# Patient Record
Sex: Female | Born: 1980 | Race: Black or African American | Hispanic: No | Marital: Married | Smoking: Never smoker
Health system: Southern US, Community
[De-identification: ages and names within clinical notes are randomized; demographics above are authoritative.]

## PROBLEM LIST (undated history)

## (undated) ENCOUNTER — Inpatient Hospital Stay (HOSPITAL_COMMUNITY): Payer: Self-pay

## (undated) DIAGNOSIS — N261 Atrophy of kidney (terminal): Secondary | ICD-10-CM

## (undated) HISTORY — DX: Atrophy of kidney (terminal): N26.1

---

## 2017-12-23 ENCOUNTER — Encounter (HOSPITAL_COMMUNITY): Payer: Self-pay | Admitting: *Deleted

## 2017-12-23 ENCOUNTER — Other Ambulatory Visit: Payer: Self-pay

## 2017-12-23 ENCOUNTER — Inpatient Hospital Stay (HOSPITAL_COMMUNITY): Payer: Self-pay

## 2017-12-23 ENCOUNTER — Inpatient Hospital Stay (HOSPITAL_COMMUNITY)
Admission: AD | Admit: 2017-12-23 | Discharge: 2017-12-23 | Disposition: A | Discharge status: Home / Self Care | Payer: PRIVATE HEALTH INSURANCE | Source: Ambulatory Visit | Attending: Family Medicine | Admitting: Family Medicine

## 2017-12-23 DIAGNOSIS — O208 Other hemorrhage in early pregnancy: Secondary | ICD-10-CM | POA: Insufficient documentation

## 2017-12-23 DIAGNOSIS — O418X10 Other specified disorders of amniotic fluid and membranes, first trimester, not applicable or unspecified: Secondary | ICD-10-CM | POA: Diagnosis not present

## 2017-12-23 DIAGNOSIS — R102 Pelvic and perineal pain: Secondary | ICD-10-CM | POA: Insufficient documentation

## 2017-12-23 DIAGNOSIS — O468X1 Other antepartum hemorrhage, first trimester: Secondary | ICD-10-CM | POA: Diagnosis not present

## 2017-12-23 DIAGNOSIS — O26891 Other specified pregnancy related conditions, first trimester: Secondary | ICD-10-CM | POA: Insufficient documentation

## 2017-12-23 DIAGNOSIS — Z3A10 10 weeks gestation of pregnancy: Secondary | ICD-10-CM | POA: Insufficient documentation

## 2017-12-23 DIAGNOSIS — O26899 Other specified pregnancy related conditions, unspecified trimester: Secondary | ICD-10-CM

## 2017-12-23 LAB — URINALYSIS, ROUTINE W REFLEX MICROSCOPIC
Bilirubin Urine: NEGATIVE
Glucose, UA: NEGATIVE mg/dL
Ketones, ur: NEGATIVE mg/dL
Nitrite: NEGATIVE
Protein, ur: NEGATIVE mg/dL
RBC / HPF: >50 RBC/hpf — ABNORMAL HIGH (ref 0–5)
Specific Gravity, Urine: 1.02 (ref 1.005–1.030)
WBC, UA: >50 WBC/hpf — ABNORMAL HIGH (ref 0–5)
pH: 6 (ref 5.0–8.0)

## 2017-12-23 LAB — HCG, QUANTITATIVE, PREGNANCY: hCG, Beta Chain, Quant, S: 7729 m[IU]/mL — ABNORMAL HIGH (ref <5)

## 2017-12-23 LAB — POCT PREGNANCY, URINE: Preg Test, Ur: POSITIVE — AB

## 2017-12-23 MED ORDER — PROMETHAZINE HCL 25 MG PO TABS: 25.0000 mg | ORAL_TABLET | Freq: Four times a day (QID) | ORAL | 2 refills | Status: DC | PRN

## 2017-12-23 NOTE — MAU Provider Note (Addendum)
History     CSN: [id number]  Arrival date and time: 12/23/17 1111   First Provider Initiated Contact with Patient 12/23/17 1203      Chief Complaint  Patient presents with  . Vaginal Bleeding  . Possible Pregnancy   HPI 37 yo G4P4004 at [redacted]w[redacted]d by LMP, who presents with vaginal bleeding that started Sunday. Had a small amount of blood in toilet when she got up. No bleeding currently. Cramping, nausea, diarrhea since Sunday, which are still continuing.   LMP 8/6, 10 weeks today.  OB History    Gravida  4   Para  4   Term  4   Preterm      AB      Living        SAB      TAB      Ectopic      Multiple      Live Births              History reviewed. No pertinent past medical history.  History reviewed. No pertinent surgical history.  History reviewed. No pertinent family history.  Social History   Tobacco Use  . Smoking status: Never Smoker  . Smokeless tobacco: Never Used  Substance Use Topics  . Alcohol use: Never    Frequency: Never  . Drug use: Never    Allergies: Not on File  Medications Prior to Admission  Medication Sig Dispense Refill Last Dose  . ibuprofen (ADVIL,MOTRIN) 200 MG tablet Take 200 mg by mouth every 6 (six) hours as needed.   12/22/2017 at Unknown time    Review of Systems  All other systems reviewed and are negative.  Physical Exam   Blood pressure 132/67, pulse 73, temperature 98.9 F (37.2 C), temperature source Oral, resp. rate 17, height 5\' 6"  (1.676 m), weight 81.5 kg, last menstrual period 10/14/2017, SpO2 100 %.  Physical Exam  Constitutional: She appears well-developed and well-nourished.  HENT:  Head: Normocephalic and atraumatic.  Right Ear: External ear normal.  Left Ear: External ear normal.  Eyes: Pupils are equal, round, and reactive to light. Conjunctivae are normal.  Neck: Normal range of motion. Neck supple.  Cardiovascular: Normal rate, regular rhythm and normal heart sounds.  Respiratory:  Effort normal and breath sounds normal.  GI: Soft. Bowel sounds are normal. She exhibits no distension and no mass. There is no tenderness. There is no rebound and no guarding.  Skin: Skin is warm and dry. No rash noted. No erythema. No pallor.  Psychiatric: She has a normal mood and affect. Her behavior is normal. Judgment and thought content normal.   Results for orders placed or performed during the hospital encounter of 12/23/17 (from the past 24 hour(s))  hCG, quantitative, pregnancy     Status: Abnormal   Collection Time: 12/23/17 12:18 PM  Result Value Ref Range   hCG, Beta Chain, Quant, S 7,729 (H) <5 mIU/mL  Urinalysis, Routine w reflex microscopic     Status: Abnormal   Collection Time: 12/23/17 12:28 PM  Result Value Ref Range   Color, Urine YELLOW YELLOW   APPearance HAZY (A) CLEAR   Specific Gravity, Urine 1.020 1.005 - 1.030   pH 6.0 5.0 - 8.0   Glucose, UA NEGATIVE NEGATIVE mg/dL   Hgb urine dipstick LARGE (A) NEGATIVE   Bilirubin Urine NEGATIVE NEGATIVE   Ketones, ur NEGATIVE NEGATIVE mg/dL   Protein, ur NEGATIVE NEGATIVE mg/dL   Nitrite NEGATIVE NEGATIVE   Leukocytes, UA LARGE (A)  NEGATIVE   RBC / HPF >50 (H) 0 - 5 RBC/hpf   WBC, UA >50 (H) 0 - 5 WBC/hpf   Bacteria, UA RARE (A) NONE SEEN   Squamous Epithelial / LPF 0-5 0 - 5   Mucus PRESENT   Pregnancy, urine POC     Status: Abnormal   Collection Time: 12/23/17 12:34 PM  Result Value Ref Range   Preg Test, Ur POSITIVE (A) NEGATIVE   US Ob Less Than 14 Weeks With Ob Transvaginal  Result Date: 12/23/2017 CLINICAL DATA:  Pelvic pain and bleeding EXAM: OBSTETRIC <14 WK [place] AND TRANSVAGINAL OB US TECHNIQUE: Both transabdominal and transvaginal ultrasound examinations were performed for complete evaluation of the gestation as well as the maternal uterus, adnexal regions, and pelvic cul-de-sac. Transvaginal technique was performed to assess early pregnancy. COMPARISON:  None FINDINGS: Intrauterine gestational sac:  Present, single Yolk sac:  Present Embryo:  Not definitely visualized Cardiac Activity: N/A Heart Rate: N/A  bpm MSD: 23.2 mm   7 w   0 d CRL:    mm    w    d                  [place] EDC: Subchorionic hemorrhage:  Moderate subchronic hemorrhage. Maternal uterus/adnexae: RIGHT ovary normal size and morphology 3.6 x 1.3 x 2.6 cm. LEFT ovary measures 3.6 x 2.3 x 4.0 cm and contains a dominant follicle 2.3 cm diameter as well as a small complex corpus luteum. Trace free pelvic fluid. No additional adnexal masses. IMPRESSION: Gestational sac within the uterus containing a yolk sac but no fetal pole. Moderate subchronic hemorrhage. Findings are suspicious but not yet definitive for failed pregnancy. Recommend follow-up US in 10-14 days for definitive diagnosis. This recommendation follows SRU consensus guidelines: Diagnostic Criteria for Nonviable Pregnancy Early in the First Trimester. Malva Limes Med 2013; 161:0960-45. Electronically Signed   By: Ulyses Southward M.D.   On: 12/23/2017 14:19     MAU Course  Procedures  MDM  Assessment and Plan  Subchorionic hematoma in first trimester, single or unspecified fetus  Pelvic pain affecting pregnancy - Plan: US OB LESS THAN 14 WEEKS WITH OB TRANSVAGINAL, US OB LESS THAN 14 WEEKS WITH OB TRANSVAGINAL  GS Measuring 7wks. Yolk sac present. Establish care.  Phenergan for n/v Will need rpt [place] in 10 days.  Levie Heritage 12/23/2017, 12:03 PM

## 2017-12-23 NOTE — MAU Note (Signed)
Is 2 months preg.  Noted bleeding yesterday.  Just a little bit. No pain.

## 2017-12-23 NOTE — Discharge Instructions (Signed)
Subchorionic Hematoma °A subchorionic hematoma is a gathering of blood between the outer wall of the placenta and the inner wall of the womb (uterus). The placenta is the organ that connects the fetus to the wall of the uterus. The placenta performs the feeding, breathing (oxygen to the fetus), and waste removal (excretory work) of the fetus. °Subchorionic hematoma is the most common abnormality found on a result from ultrasonography done during the first trimester or early second trimester of pregnancy. If there has been little or no vaginal bleeding, early small hematomas usually shrink on their own and do not affect your baby or pregnancy. The blood is gradually absorbed over 1-2 weeks. When bleeding starts later in pregnancy or the hematoma is larger or occurs in an older pregnant woman, the outcome may not be as good. Larger hematomas may get bigger, which increases the chances for miscarriage. Subchorionic hematoma also increases the risk of premature detachment of the placenta from the uterus, preterm (premature) labor, and stillbirth. °Follow these instructions at home: °· Stay on bed rest if your health care provider recommends this. Although bed rest will not prevent more bleeding or prevent a miscarriage, your health care provider may recommend bed rest until you are advised otherwise. °· Avoid heavy lifting (more than 10 lb [4.5 kg]), exercise, sexual intercourse, or douching as directed by your health care provider. °· Keep track of the number of pads you use each day and how soaked (saturated) they are. Write down this information. °· Do not use tampons. °· Keep all follow-up appointments as directed by your health care provider. Your health care provider may ask you to have follow-up blood tests or ultrasound tests or both. °Get help right away if: °· You have severe cramps in your stomach, back, abdomen, or pelvis. °· You have a fever. °· You pass large clots or tissue. Save any tissue for your  health care provider to look at. °· Your bleeding increases or you become lightheaded, feel weak, or have fainting episodes. °This information is not intended to replace advice given to you by your health care provider. Make sure you discuss any questions you have with your health care provider. °Document Released: 06/12/2006 Document Revised: 08/03/2015 Document Reviewed: 09/24/2012 °Elsevier Interactive Patient Education © 2017 [employer] Inc. ° °

## 2017-12-23 NOTE — MAU Note (Signed)
Pt reports bleeding that started Sunday morning.

## 2018-01-21 DIAGNOSIS — O9A212 Injury, poisoning and certain other consequences of external causes complicating pregnancy, second trimester: Secondary | ICD-10-CM | POA: Diagnosis not present

## 2018-01-21 DIAGNOSIS — O9989 Other specified diseases and conditions complicating pregnancy, childbirth and the puerperium: Secondary | ICD-10-CM | POA: Diagnosis not present

## 2018-01-21 DIAGNOSIS — M545 Low back pain: Secondary | ICD-10-CM | POA: Diagnosis not present

## 2018-01-21 DIAGNOSIS — R935 Abnormal findings on diagnostic imaging of other abdominal regions, including retroperitoneum: Secondary | ICD-10-CM | POA: Diagnosis not present

## 2018-01-21 DIAGNOSIS — Z3A14 14 weeks gestation of pregnancy: Secondary | ICD-10-CM | POA: Diagnosis not present

## 2018-01-21 DIAGNOSIS — O3680X0 Pregnancy with inconclusive fetal viability, not applicable or unspecified: Secondary | ICD-10-CM | POA: Diagnosis not present

## 2018-01-21 DIAGNOSIS — S39012A Strain of muscle, fascia and tendon of lower back, initial encounter: Secondary | ICD-10-CM | POA: Diagnosis not present

## 2018-01-27 ENCOUNTER — Encounter: Payer: Self-pay | Admitting: General Practice

## 2018-01-27 DIAGNOSIS — O200 Threatened abortion: Secondary | ICD-10-CM

## 2018-01-27 NOTE — Progress Notes (Signed)
Patient & her husband came by the office today trying to schedule initial OB visit. Upon chart review, patient had ultrasound 10/15 that was to be repeated in 10-14 days. Ultrasound ordered & scheduled 11/25 @ 1pm. Patient's husband informed. Advised him to bring patient to MAU for any severe pain/bleeding. He verbalized understanding.

## 2018-02-02 ENCOUNTER — Encounter: Payer: Self-pay | Admitting: Family Medicine

## 2018-02-02 ENCOUNTER — Other Ambulatory Visit: Payer: Self-pay | Admitting: Family Medicine

## 2018-02-02 ENCOUNTER — Ambulatory Visit (HOSPITAL_COMMUNITY)
Admission: RE | Admit: 2018-02-02 | Discharge: 2018-02-02 | Disposition: A | Discharge status: Home / Self Care | Payer: Medicaid Other | Source: Ambulatory Visit | Attending: Family Medicine | Admitting: Family Medicine

## 2018-02-02 ENCOUNTER — Encounter: Payer: Self-pay | Admitting: *Deleted

## 2018-02-02 ENCOUNTER — Ambulatory Visit: Payer: Medicaid Other | Admitting: *Deleted

## 2018-02-02 VITALS — BP 116/53 | HR 70 | Wt 175.0 lb

## 2018-02-02 DIAGNOSIS — N83291 Other ovarian cyst, right side: Secondary | ICD-10-CM | POA: Diagnosis not present

## 2018-02-02 DIAGNOSIS — O200 Threatened abortion: Secondary | ICD-10-CM | POA: Insufficient documentation

## 2018-02-02 DIAGNOSIS — O3481 Maternal care for other abnormalities of pelvic organs, first trimester: Secondary | ICD-10-CM | POA: Diagnosis not present

## 2018-02-02 DIAGNOSIS — Z3A00 Weeks of gestation of pregnancy not specified: Secondary | ICD-10-CM | POA: Insufficient documentation

## 2018-02-02 NOTE — Progress Notes (Signed)
Chart reviewed - agree with RN documentation.   
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
## 2018-02-03 ENCOUNTER — Encounter: Payer: Self-pay | Admitting: Obstetrics and Gynecology

## 2018-02-03 ENCOUNTER — Ambulatory Visit (INDEPENDENT_AMBULATORY_CARE_PROVIDER_SITE_OTHER): Payer: Medicaid Other | Admitting: Obstetrics and Gynecology

## 2018-02-03 VITALS — BP 143/79 | HR 96 | Wt 174.9 lb

## 2018-02-03 DIAGNOSIS — O021 Missed abortion: Secondary | ICD-10-CM

## 2018-02-03 LAB — BETA HCG QUANT (REF LAB): hCG Quant: 183 m[IU]/mL

## 2018-02-03 NOTE — Progress Notes (Signed)
Video interperter # [id number]

## 2018-02-03 NOTE — Progress Notes (Signed)
37 yo G5P4 presenting today to discuss results of ultrasound and pregnancy hormone collected yesterday. Patient reports some lower abdominal cramping pain. She denies any vaginal bleeding since October.   No past medical history on file. No past surgical history on file. No family history on file. Social History   Tobacco Use  . Smoking status: Never Smoker  . Smokeless tobacco: Never Used  Substance Use Topics  . Alcohol use: Never    Frequency: Never  . Drug use: Never   ROS See pertinent in HPI  Blood pressure (!) 143/79, pulse 96, weight 174 lb 14.4 oz (79.3 kg), last menstrual period 10/14/2017. GENERAL: Well-developed, well-nourished female in no acute distress.  LUNGS: Clear to auscultation bilaterally.  HEART: Regular rate and rhythm. ABDOMEN: Soft, nontender, nondistended. No organomegaly. PELVIC: Normal external female genitalia. Vagina is pink and rugated.  Normal discharge. Normal appearing cervix. Uterus is normal in size. No adnexal mass or tenderness. EXTREMITIES: No cyanosis, clubbing, or edema, 2+ distal pulses.  [place] Ob Less Than 14 Weeks With Ob Transvaginal  Result Date: 02/02/2018 CLINICAL DATA:  Threatened abortion, confirm viability; no quantitative beta HCG for correlation EXAM: OBSTETRIC <14 WK US AND TRANSVAGINAL OB US TECHNIQUE: Both transabdominal and transvaginal ultrasound examinations were performed for complete evaluation of the gestation as well as the maternal uterus, adnexal regions, and pelvic cul-de-sac. Transvaginal technique was performed to assess early pregnancy. COMPARISON:  12/23/2017 FINDINGS: Intrauterine gestational sac: Irregular sac seen within central uterus with surrounding abnormal complex tissue/fluid Yolk sac:  Not identified Embryo:  Not identified Cardiac Activity: N/A Heart Rate: N/A  bpm MSD:   mm    w     d CRL:    mm    w    d                  US EDC: Subchorionic hemorrhage:  N/A Maternal uterus/adnexae: Centrally within the  uterus, a dominant irregular fluid collection is seen with surrounding abnormal complex soft tissue/fluid, collection overall measuring 3.5 x 2.6 x 3.8 cm. This could represent an intrauterine gestational sac with extensive subchronic hemorrhage or gestational trophoblastic disease. No free pelvic fluid. LEFT ovary normal size and morphology, 3.8 x 2.0 x 3.5 cm. RIGHT ovary 4.9 x 5.1 x 3.8 cm and contains a large simple cyst 4.3 x 3.2 x 4.2 cm. Trace free pelvic fluid. No other adnexal masses. IMPRESSION: Large simple RIGHT ovarian cyst 4.3 cm diameter. Irregular dominant fluid collection centrally within uterus with extensive surrounding abnormal complex soft tissue and fluid collections, overall measuring 3.5 x 2.6 x 3.8 cm in size. This could either represent an intrauterine gestational sac with surrounding extensive subchronic hemorrhage or gestational trophoblastic disease. Correlation with current quantitative beta HCG recommended, as well as follow-up serial quantitative beta HCG and/or sonography. Electronically Signed   By: Ulyses SouthwardMark  Boles M.D.   On: 02/02/2018 14:08   Quant HCG 7729 (12/23/2017)--> 183 (02/02/2018)  A/P 37 yo with missed abortion - Ultrasound and quant HCG results reviewed with the patient - Emotional support provided - Discussed options of expectant management, medical management with cytotec or surgical evacuation with D&E. Patient opted for D&E to accommodate her work schedule. Patient will be scheduled

## 2018-02-09 ENCOUNTER — Encounter (HOSPITAL_COMMUNITY): Payer: Self-pay

## 2018-02-13 ENCOUNTER — Encounter (HOSPITAL_COMMUNITY)
Admission: RE | Disposition: A | Discharge status: Home / Self Care | Payer: Self-pay | Source: Ambulatory Visit | Attending: Obstetrics and Gynecology

## 2018-02-13 ENCOUNTER — Other Ambulatory Visit: Payer: Self-pay

## 2018-02-13 ENCOUNTER — Ambulatory Visit (HOSPITAL_COMMUNITY): Payer: Medicaid Other | Admitting: Anesthesiology

## 2018-02-13 ENCOUNTER — Encounter (HOSPITAL_COMMUNITY): Payer: Self-pay | Admitting: Emergency Medicine

## 2018-02-13 ENCOUNTER — Other Ambulatory Visit (HOSPITAL_COMMUNITY): Payer: Medicaid Other

## 2018-02-13 ENCOUNTER — Ambulatory Visit (HOSPITAL_COMMUNITY): Payer: Medicaid Other

## 2018-02-13 ENCOUNTER — Ambulatory Visit (HOSPITAL_COMMUNITY)
Admission: RE | Admit: 2018-02-13 | Discharge: 2018-02-13 | Disposition: A | Discharge status: Home / Self Care | Payer: Medicaid Other | Source: Ambulatory Visit | Attending: Obstetrics and Gynecology | Admitting: Obstetrics and Gynecology

## 2018-02-13 DIAGNOSIS — O039 Complete or unspecified spontaneous abortion without complication: Secondary | ICD-10-CM

## 2018-02-13 DIAGNOSIS — Z79899 Other long term (current) drug therapy: Secondary | ICD-10-CM | POA: Diagnosis not present

## 2018-02-13 DIAGNOSIS — Z3A14 14 weeks gestation of pregnancy: Secondary | ICD-10-CM | POA: Insufficient documentation

## 2018-02-13 DIAGNOSIS — O021 Missed abortion: Secondary | ICD-10-CM | POA: Diagnosis not present

## 2018-02-13 DIAGNOSIS — N83291 Other ovarian cyst, right side: Secondary | ICD-10-CM | POA: Insufficient documentation

## 2018-02-13 HISTORY — PX: DILATION AND EVACUATION: SHX1459

## 2018-02-13 LAB — TYPE AND SCREEN
ABO/RH(D): O POS
Antibody Screen: NEGATIVE

## 2018-02-13 LAB — CBC
HCT: 36.9 % (ref 36.0–46.0)
Hemoglobin: 12 g/dL (ref 12.0–15.0)
MCH: 27.5 pg (ref 26.0–34.0)
MCHC: 32.5 g/dL (ref 30.0–36.0)
MCV: 84.4 fL (ref 80.0–100.0)
Platelets: 199 10*3/uL (ref 150–400)
RBC: 4.37 MIL/uL (ref 3.87–5.11)
RDW: 13.7 % (ref 11.5–15.5)
WBC: 4.6 10*3/uL (ref 4.0–10.5)
nRBC: 0 % (ref 0.0–0.2)

## 2018-02-13 LAB — BASIC METABOLIC PANEL
Anion gap: 7 (ref 5–15)
BUN: 17 mg/dL (ref 6–20)
CO2: 26 mmol/L (ref 22–32)
Calcium: 9.2 mg/dL (ref 8.9–10.3)
Chloride: 103 mmol/L (ref 98–111)
Creatinine, Ser: 0.76 mg/dL (ref 0.44–1.00)
GFR calc Af Amer: >60 mL/min (ref >60)
GFR calc non Af Amer: >60 mL/min (ref >60)
Glucose, Bld: 106 mg/dL — ABNORMAL HIGH (ref 70–99)
Potassium: 3.9 mmol/L (ref 3.5–5.1)
Sodium: 136 mmol/L (ref 135–145)

## 2018-02-13 LAB — ABO/RH: ABO/RH(D): O POS

## 2018-02-13 SURGERY — DILATION AND EVACUATION, UTERUS
Anesthesia: General

## 2018-02-13 MED ORDER — DOXYCYCLINE HYCLATE 100 MG IV SOLR: 200.0000 mg | INTRAVENOUS | Status: DC

## 2018-02-13 MED ORDER — ONDANSETRON HCL 4 MG/2ML IJ SOLN
INTRAMUSCULAR | Status: AC
  Filled 2018-02-13: qty 2

## 2018-02-13 MED ORDER — METOCLOPRAMIDE HCL 5 MG/ML IJ SOLN: 10.0000 mg | Freq: Once | INTRAMUSCULAR | Status: DC | PRN

## 2018-02-13 MED ORDER — LACTATED RINGERS IV SOLN: INTRAVENOUS | Status: DC

## 2018-02-13 MED ORDER — KETOROLAC TROMETHAMINE 30 MG/ML IJ SOLN
INTRAMUSCULAR | Status: AC
  Filled 2018-02-13: qty 1

## 2018-02-13 MED ORDER — FENTANYL CITRATE (PF) 100 MCG/2ML IJ SOLN
INTRAMUSCULAR | Status: AC
  Filled 2018-02-13: qty 2

## 2018-02-13 MED ORDER — LACTATED RINGERS IV SOLN
INTRAVENOUS | Status: DC
  Administered 2018-02-13: 15:00:00 via INTRAVENOUS

## 2018-02-13 MED ORDER — MIDAZOLAM HCL 2 MG/2ML IJ SOLN
INTRAMUSCULAR | Status: AC
  Filled 2018-02-13: qty 2

## 2018-02-13 MED ORDER — FENTANYL CITRATE (PF) 100 MCG/2ML IJ SOLN
INTRAMUSCULAR | Status: DC | PRN
  Administered 2018-02-13 (×2): 50 ug via INTRAVENOUS

## 2018-02-13 MED ORDER — SCOPOLAMINE 1 MG/3DAYS TD PT72
1.0000 | MEDICATED_PATCH | Freq: Once | TRANSDERMAL | Status: DC
  Administered 2018-02-13: 1.5 mg via TRANSDERMAL

## 2018-02-13 MED ORDER — HYDROCODONE-ACETAMINOPHEN 7.5-325 MG PO TABS
ORAL_TABLET | ORAL | Status: AC
  Filled 2018-02-13: qty 1

## 2018-02-13 MED ORDER — MEPERIDINE HCL 25 MG/ML IJ SOLN: 6.2500 mg | INTRAMUSCULAR | Status: DC | PRN

## 2018-02-13 MED ORDER — LIDOCAINE HCL (CARDIAC) PF 100 MG/5ML IV SOSY
PREFILLED_SYRINGE | INTRAVENOUS | Status: DC | PRN
  Administered 2018-02-13: 60 mg via INTRAVENOUS

## 2018-02-13 MED ORDER — SUCCINYLCHOLINE CHLORIDE 20 MG/ML IJ SOLN
INTRAMUSCULAR | Status: DC | PRN
  Administered 2018-02-13: 120 mg via INTRAVENOUS

## 2018-02-13 MED ORDER — FENTANYL CITRATE (PF) 100 MCG/2ML IJ SOLN: 25.0000 ug | INTRAMUSCULAR | Status: DC | PRN

## 2018-02-13 MED ORDER — DEXAMETHASONE SODIUM PHOSPHATE 4 MG/ML IJ SOLN
INTRAMUSCULAR | Status: AC
  Filled 2018-02-13: qty 1

## 2018-02-13 MED ORDER — SUCCINYLCHOLINE CHLORIDE 200 MG/10ML IV SOSY
PREFILLED_SYRINGE | INTRAVENOUS | Status: AC
  Filled 2018-02-13: qty 10

## 2018-02-13 MED ORDER — MIDAZOLAM HCL 2 MG/2ML IJ SOLN
INTRAMUSCULAR | Status: DC | PRN
  Administered 2018-02-13: 2 mg via INTRAVENOUS

## 2018-02-13 MED ORDER — HYDROCODONE-ACETAMINOPHEN 7.5-325 MG PO TABS
1.0000 | ORAL_TABLET | Freq: Once | ORAL | Status: AC | PRN
  Administered 2018-02-13: 1 via ORAL

## 2018-02-13 MED ORDER — SCOPOLAMINE 1 MG/3DAYS TD PT72
MEDICATED_PATCH | TRANSDERMAL | Status: AC
  Administered 2018-02-13: 1.5 mg via TRANSDERMAL
  Filled 2018-02-13: qty 1

## 2018-02-13 MED ORDER — PROPOFOL 10 MG/ML IV BOLUS
INTRAVENOUS | Status: AC
  Filled 2018-02-13: qty 20

## 2018-02-13 MED ORDER — ONDANSETRON HCL 4 MG/2ML IJ SOLN
INTRAMUSCULAR | Status: DC | PRN
  Administered 2018-02-13: 4 mg via INTRAVENOUS

## 2018-02-13 MED ORDER — OXYCODONE-ACETAMINOPHEN 5-325 MG PO TABS: 1.0000 | ORAL_TABLET | Freq: Four times a day (QID) | ORAL | 0 refills | Status: DC | PRN

## 2018-02-13 MED ORDER — DOXYCYCLINE HYCLATE 100 MG IV SOLR
200.0000 mg | INTRAVENOUS | Status: AC
  Administered 2018-02-13: 200 mg via INTRAVENOUS
  Filled 2018-02-13: qty 200

## 2018-02-13 MED ORDER — LIDOCAINE HCL (CARDIAC) PF 100 MG/5ML IV SOSY
PREFILLED_SYRINGE | INTRAVENOUS | Status: AC
  Filled 2018-02-13: qty 5

## 2018-02-13 MED ORDER — DEXAMETHASONE SODIUM PHOSPHATE 10 MG/ML IJ SOLN
INTRAMUSCULAR | Status: DC | PRN
  Administered 2018-02-13: 8 mg via INTRAVENOUS

## 2018-02-13 MED ORDER — PROPOFOL 10 MG/ML IV BOLUS
INTRAVENOUS | Status: DC | PRN
  Administered 2018-02-13: 200 mg via INTRAVENOUS
  Administered 2018-02-13: 100 mg via INTRAVENOUS

## 2018-02-13 MED ORDER — IBUPROFEN 600 MG PO TABS: 600.0000 mg | ORAL_TABLET | Freq: Four times a day (QID) | ORAL | 1 refills | Status: DC | PRN

## 2018-02-13 SURGICAL SUPPLY — 19 items
CATH ROBINSON RED A/P 16FR (CATHETERS) ×2 IMPLANT
DECANTER SPIKE VIAL GLASS SM (MISCELLANEOUS) ×2 IMPLANT
GLOVE BIOGEL PI IND STRL 6.5 (GLOVE) ×1 IMPLANT
GLOVE BIOGEL PI IND STRL 7.0 (GLOVE) ×1 IMPLANT
GLOVE BIOGEL PI INDICATOR 6.5 (GLOVE) ×1
GLOVE BIOGEL PI INDICATOR 7.0 (GLOVE) ×1
GLOVE ORTHOPEDIC STR SZ6.5 (GLOVE) ×2 IMPLANT
GOWN STRL REUS W/TWL LRG LVL3 (GOWN DISPOSABLE) ×4 IMPLANT
KIT BERKELEY 1ST TRIMESTER 3/8 (MISCELLANEOUS) ×2 IMPLANT
NS IRRIG 1000ML POUR BTL (IV SOLUTION) ×2 IMPLANT
PACK VAGINAL MINOR WOMEN LF (CUSTOM PROCEDURE TRAY) ×2 IMPLANT
PAD OB MATERNITY 4.3X12.25 (PERSONAL CARE ITEMS) ×2 IMPLANT
PAD PREP 24X48 CUFFED NSTRL (MISCELLANEOUS) ×2 IMPLANT
SET BERKELEY SUCTION TUBING (SUCTIONS) ×2 IMPLANT
TOWEL OR 17X24 6PK STRL BLUE (TOWEL DISPOSABLE) ×4 IMPLANT
VACURETTE 10 RIGID CVD (CANNULA) IMPLANT
VACURETTE 7MM CVD STRL WRAP (CANNULA) IMPLANT
VACURETTE 8 RIGID CVD (CANNULA) ×2 IMPLANT
VACURETTE 9 RIGID CVD (CANNULA) IMPLANT

## 2018-02-13 NOTE — Anesthesia Postprocedure Evaluation (Signed)
Anesthesia Post Note  Patient: Corena HerterBerthy Virgin  Procedure(s) Performed: DILATATION AND EVACUATION (N/A )     Patient location during evaluation: PACU Anesthesia Type: General Level of consciousness: awake and alert and oriented Pain management: pain level controlled Vital Signs Assessment: post-procedure vital signs reviewed and stable Respiratory status: spontaneous breathing, nonlabored ventilation and respiratory function stable Cardiovascular status: stable and blood pressure returned to baseline Postop Assessment: no apparent nausea or vomiting Anesthetic complications: no    Last Vitals:  Vitals:   02/13/18 1645 02/13/18 1650  BP: (!) 141/71   Pulse: (!) 54 (!) 58  Resp: 12 17  Temp:    SpO2: 94% 97%    Last Pain:  Vitals:   02/13/18 1645  TempSrc:   PainSc: 2    Pain Goal: Patients Stated Pain Goal: 5 (02/13/18 1645)               Alahna Dunne A.

## 2018-02-13 NOTE — Anesthesia Procedure Notes (Signed)
Procedure Name: Intubation Date/Time: 02/13/2018 3:22 PM Performed by: Hewitt Blade, CRNA Pre-anesthesia Checklist: Patient identified, Emergency Drugs available, Suction available and Patient being monitored Patient Re-evaluated:Patient Re-evaluated prior to induction Oxygen Delivery Method: Circle system utilized Preoxygenation: Pre-oxygenation with 100% oxygen Induction Type: IV induction Ventilation: Mask ventilation without difficulty Laryngoscope Size: Mac and 4 Grade View: Grade II Tube type: Oral Tube size: 7.0 mm Number of attempts: 2 Airway Equipment and Method: Stylet Placement Confirmation: ETT inserted through vocal cords under direct vision,  positive ETCO2 and breath sounds checked- equal and bilateral Secured at: 22 cm Tube secured with: Tape Dental Injury: Teeth and Oropharynx as per pre-operative assessment

## 2018-02-13 NOTE — Transfer of Care (Signed)
Immediate Anesthesia Transfer of Care Note  Patient: Jackie HerterBerthy Yount  Procedure(s) Performed: DILATATION AND EVACUATION (N/A )  Patient Location: PACU  Anesthesia Type:General  Level of Consciousness: awake, alert  and oriented  Airway & Oxygen Therapy: Patient Spontanous Breathing and Patient connected to nasal cannula oxygen  Post-op Assessment: Report given to RN, Post -op Vital signs reviewed and stable and Patient moving all extremities  Post vital signs: Reviewed and stable  Last Vitals:  Vitals Value Taken Time  BP 148/79 02/13/2018  3:57 PM  Temp    Pulse 73 02/13/2018  3:59 PM  Resp 24 02/13/2018  3:59 PM  SpO2 100 % 02/13/2018  3:59 PM  Vitals shown include unvalidated device data.  Last Pain:  Vitals:   02/13/18 1425  TempSrc: Oral  PainSc: 2       Patients Stated Pain Goal: 5 (02/13/18 1425)  Complications: No apparent anesthesia complications

## 2018-02-13 NOTE — Anesthesia Preprocedure Evaluation (Addendum)
Anesthesia Evaluation  Patient identified by MRN, date of birth, ID band Patient awake    Reviewed: Allergy & Precautions, NPO status , Patient's Chart, lab work & pertinent test results  Airway Mallampati: III  TM Distance: >3 FB Neck ROM: Full    Dental no notable dental hx. (+) Teeth Intact   Pulmonary neg pulmonary ROS,    Pulmonary exam normal breath sounds clear to auscultation       Cardiovascular negative cardio ROS Normal cardiovascular exam Rhythm:Regular Rate:Normal     Neuro/Psych negative neurological ROS  negative psych ROS   GI/Hepatic negative GI ROS, Neg liver ROS,   Endo/Other  negative endocrine ROS  Renal/GU negative Renal ROS  negative genitourinary   Musculoskeletal negative musculoskeletal ROS (+)   Abdominal   Peds  Hematology negative hematology ROS (+)   Anesthesia Other Findings   Reproductive/Obstetrics Missed Ab                             Anesthesia Physical Anesthesia Plan  ASA: II  Anesthesia Plan: General   Post-op Pain Management:    Induction:   PONV Risk Score and Plan: 3 and Midazolam, Ondansetron, Dexamethasone, Treatment may vary due to age or medical condition and Propofol infusion  Airway Management Planned: LMA  Additional Equipment:   Intra-op Plan:   Post-operative Plan: Extubation in OR  Informed Consent: I have reviewed the patients History and Physical, chart, labs and discussed the procedure including the risks, benefits and alternatives for the proposed anesthesia with the patient or authorized representative who has indicated his/her understanding and acceptance.   Dental advisory given  Plan Discussed with: CRNA and Surgeon  Anesthesia Plan Comments: (Congolese interpreter used for preop.)       Anesthesia Quick Evaluation

## 2018-02-13 NOTE — Discharge Instructions (Signed)
DISCHARGE INSTRUCTIONS: D&E The following instructions have been prepared to help you care for yourself upon your return home.   Personal hygiene:  Use sanitary pads for vaginal drainage, not tampons.  Shower the day after your procedure.  NO tub baths, pools or Jacuzzis for 2-3 weeks.  Wipe front to back after using the bathroom.  Activity and limitations:  Do NOT drive or operate any equipment for 24 hours. The effects of anesthesia are still present and drowsiness may result.  Do NOT rest in bed all day.  Walking is encouraged.  Walk up and down stairs slowly.  You may resume your normal activity in one to two days or as indicated by your physician.  Sexual activity: NO intercourse for at least 2 weeks after the procedure, or as indicated by your physician.  Return to work: You may resume your work activities in one to two days or as indicated by your doctor.  What to expect after your surgery: Expect to have vaginal bleeding/discharge for 2-3 days and spotting for up to 10 days. It is not unusual to have soreness for up to 1-2 weeks. You may have a slight burning sensation when you urinate for the first day. Mild cramps may continue for a couple of days. You may have a regular period in 2-6 weeks.  Call your doctor for any of the following:  Excessive vaginal bleeding, saturating and changing one pad every hour.  Inability to urinate 6 hours after discharge from hospital.  Pain not relieved by pain medication.  Fever of 100.4 F or greater.  Unusual vaginal discharge or odor.  Post Anesthesia Home Care Instructions  Activity: Get plenty of rest for the remainder of the day. A responsible individual must stay with you for 24 hours following the procedure.  For the next 24 hours, DO NOT: -Drive a car -Advertising copywriterperate machinery -Drink alcoholic beverages -Take any medication unless instructed by your physician -Make any legal decisions or sign important  papers.  Meals: Start with liquid foods such as gelatin or soup. Progress to regular foods as tolerated. Avoid greasy, spicy, heavy foods. If nausea and/or vomiting occur, drink only clear liquids until the nausea and/or vomiting subsides. Call your physician if vomiting continues.  Special Instructions/Symptoms: Your throat may feel dry or sore from the anesthesia or the breathing tube placed in your throat during surgery. If this causes discomfort, gargle with warm salt water. The discomfort should disappear within 24 hours.  If you had a scopolamine patch placed behind your ear for the management of post- operative nausea and/or vomiting:  1. The medication in the patch is effective for 72 hours, after which it should be removed.  Wrap patch in a tissue and discard in the trash. Wash hands thoroughly with soap and water. 2. You may remove the patch earlier than 72 hours if you experience unpleasant side effects which may include dry mouth, dizziness or visual disturbances. 3. Avoid touching the patch. Wash your hands with soap and water after contact with the patch.

## 2018-02-13 NOTE — Op Note (Signed)
Jackie Nelson PROCEDURE DATE:  02/13/2018  PREOPERATIVE DIAGNOSIS: missed abortion @ 14 weeks, concern for molar pregnancy POSTOPERATIVE DIAGNOSIS: The same PROCEDURE: suction dilation and evacuation under ultrasound guidance SURGEON:  Dr. Baldemar LenisK. Meryl Armstrong Creasy  INDICATIONS: 37 y.o.  [id number] presenting with bleeding s/p missed abortion, who desires surgical management.  Risks of surgery were discussed with the patient including but not limited to: bleeding which may require transfusion; infection which may require antibiotics; injury to uterus or surrounding organs; need for additional procedures including laparotomy or laparoscopy; possibility of intrauterine scarring which may impair future fertility; and other postoperative/anesthesia complications. Written informed consent was obtained.  ABO, Rh: --/--/O POS, O POS Performed at [facility], [address] [place], [place] [id number]  [phone](12/06 1314) CBC    Component Value Date/Time   WBC 4.6 02/13/2018 1314   RBC 4.37 02/13/2018 1314   HGB 12.0 02/13/2018 1314   HCT 36.9 02/13/2018 1314   PLT 199 02/13/2018 1314   MCV 84.4 02/13/2018 1314   MCH 27.5 02/13/2018 1314   MCHC 32.5 02/13/2018 1314   RDW 13.7 02/13/2018 1314     FINDINGS:   Significant amount of retained products of conception within uterus with some bright red blood in endometrium. Empty endometrial stripe noted on ultrasound at the end of the procedure.   ANESTHESIA:   General anesthesia INTRAVENOUS FLUIDS:  700mL of LR ESTIMATED BLOOD LOSS:  10 mL URINE OUTPUT: 200 mL clear yellow urine SPECIMENS:  Products of conception sent to pathology COMPLICATIONS:  None immediate.  PROCEDURE DETAILS:  The patient received intravenous Doxycycline while in the preoperative area.  She was then taken to the operating room where anesthesia was administered and was found to be adequate.  After an adequate timeout was performed, she was placed in the dorsal lithotomy position and  examined; then prepped and draped in the sterile manner.   Her bladder was catheterized for return of clear, yellow urine. A vaginal speculum was then placed in the patient's vagina and a single tooth tenaculum was applied to the anterior lip of the cervix.  The cervix was gently dilated under ultrasound guidance to accommodate a 8 mm suction curette. The suction curette was gently advanced to the uterine fundus and activated. The curette was slowly rotated to clear the uterus of products of conception.  This was repeated until the endometrial cavity was cleared and a clean stripe was noted on ultrasound. A sharp curettage was then performed to confirm complete emptying of the uterus. The suction curette was advanced to the fundus and activated one last time under ultrasound guidance and removed and the procedure was finished. There was an empty endometrial stripe noted on the ultrasound at the end of the curettage. There was minimal bleeding noted at the end of the procedure, and the tenaculum removed with good hemostasis noted at the tenaculum site.  All instruments were removed from the patient's vagina.  Sponge and instrument counts were correct times three.  The patient tolerated the procedure well and was taken to the recovery area awake, extubated and in stable condition.  The patient will be discharged to home as per PACU criteria.  Routine postoperative instructions given.  She was prescribed Percocet & Ibuprofen.  She will follow up in the clinic in 2-3 weeks for postoperative evaluation.   Baldemar LenisK. Meryl Iyad Deroo, M.D. Attending Center for [facility](Faculty Practice)

## 2018-02-13 NOTE — H&P (Signed)
OB/GYN History and Physical  Jackie HerterBerthy Linskey is a 37 y.o. [id number] presenting for surgical management of missed abortion. She is feeling well with no complaints.       History reviewed. No pertinent past medical history.  History reviewed. No pertinent surgical history.  OB History  Gravida Para Term Preterm AB Living  5 4 4         SAB TAB Ectopic Multiple Live Births               # Outcome Date GA Lbr Len/2nd Weight Sex Delivery Anes PTL Lv  5 Current           4 Term           3 Term           2 Term           1 Term             Social History   Socioeconomic History  . Marital status: Married    Spouse name: Not on file  . Number of children: Not on file  . Years of education: Not on file  . Highest education level: Not on file  Occupational History  . Not on file  Social Needs  . Financial resource strain: Not on file  . Food insecurity:    Worry: Not on file    Inability: Not on file  . Transportation needs:    Medical: Not on file    Non-medical: Not on file  Tobacco Use  . Smoking status: Never Smoker  . Smokeless tobacco: Never Used  Substance and Sexual Activity  . Alcohol use: Never    Frequency: Never  . Drug use: Never  . Sexual activity: Yes    Birth control/protection: None    Comment: friday last week  Lifestyle  . Physical activity:    Days per week: Not on file    Minutes per session: Not on file  . Stress: Not on file  Relationships  . Social connections:    Talks on phone: Not on file    Gets together: Not on file    Attends religious service: Not on file    Active member of club or organization: Not on file    Attends meetings of clubs or organizations: Not on file    Relationship status: Not on file  Other Topics Concern  . Not on file  Social History Narrative  . Not on file    History reviewed. No pertinent family history.  Medications Prior to Admission  Medication Sig Dispense Refill Last Dose  . Ascorbic Acid  (VITAMIN C) 100 MG tablet Take 100 mg by mouth daily.     . promethazine (PHENERGAN) 25 MG tablet Take 1 tablet (25 mg total) by mouth every 6 (six) hours as needed for nausea or vomiting. (Patient taking differently: Take 25 mg by mouth at bedtime. ) 30 tablet 2 02/12/2018 at Unknown time    No Known Allergies  Review of Systems: Negative except for what is mentioned in HPI.     Physical Exam: LMP 10/14/2017  CONSTITUTIONAL: Well-developed, well-nourished female in no acute distress.  HENT:  Normocephalic, atraumatic, External right and left ear normal. Oropharynx is clear and moist EYES: Conjunctivae and EOM are normal. Pupils are equal, round, and reactive to light. No scleral icterus.  NECK: Normal range of motion, supple, no masses SKIN: Skin is warm and dry. No rash noted. Not diaphoretic. No erythema. No pallor.  NEUROLGIC: Alert and oriented to person, place, and time. Normal reflexes, muscle tone coordination. No cranial nerve deficit noted. PSYCHIATRIC: Normal mood and affect. Normal behavior. Normal judgment and thought content. CARDIOVASCULAR: Normal heart rate noted, regular rhythm RESPIRATORY: Effort and breath sounds normal, no problems with respiration noted ABDOMEN: Soft, nontender, nondistended, gravid.  PELVIC: Deferred MUSCULOSKELETAL: Normal range of motion. No edema and no tenderness. 2+ distal pulses.   Pertinent Labs/Studies:   Results for orders placed or performed during the hospital encounter of 02/13/18 (from the past 72 hour(s))  CBC     Status: None   Collection Time: 02/13/18  1:14 PM  Result Value Ref Range   WBC 4.6 4.0 - 10.5 K/uL   RBC 4.37 3.87 - 5.11 MIL/uL   Hemoglobin 12.0 12.0 - 15.0 g/dL   HCT 16.1 09.6 - 04.5 %   MCV 84.4 80.0 - 100.0 fL   MCH 27.5 26.0 - 34.0 pg   MCHC 32.5 30.0 - 36.0 g/dL   RDW 40.9 81.1 - 91.4 %   Platelets 199 150 - 400 K/uL   nRBC 0.0 0.0 - 0.2 %    Comment: Performed at [facility], [address]  [place], [place] [address]  Type and screen     Status: None (Preliminary result)   Collection Time: 02/13/18  1:14 PM  Result Value Ref Range   ABO/RH(D) O POS    Antibody Screen PENDING    Sample Expiration      02/16/2018 Performed at [facility], [address], [place], [place] [address]   Basic metabolic panel     Status: Abnormal   Collection Time: 02/13/18  1:14 PM  Result Value Ref Range   Sodium 136 135 - 145 mmol/L   Potassium 3.9 3.5 - 5.1 mmol/L   Chloride 103 98 - 111 mmol/L   CO2 26 22 - 32 mmol/L   Glucose, Bld 106 (H) 70 - 99 mg/dL   BUN 17 6 - 20 mg/dL   Creatinine, Ser 8.65 0.44 - 1.00 mg/dL   Calcium 9.2 8.9 - 78.4 mg/dL   GFR calc non Af Amer >60 >60 mL/min   GFR calc Af Amer >60 >60 mL/min   Anion gap 7 5 - 15    Comment: Performed at [facility], [address], [place], [place] [address]   CLINICAL DATA:  Threatened abortion, confirm viability; no quantitative beta HCG for correlation  EXAM: OBSTETRIC <14 WK [place] AND TRANSVAGINAL OB US  TECHNIQUE: Both transabdominal and transvaginal ultrasound examinations were performed for complete evaluation of the gestation as well as the maternal uterus, adnexal regions, and pelvic cul-de-sac. Transvaginal technique was performed to assess early pregnancy.  COMPARISON:  12/23/2017  FINDINGS: Intrauterine gestational sac: Irregular sac seen within central uterus with surrounding abnormal complex tissue/fluid  Yolk sac:  Not identified  Embryo:  Not identified  Cardiac Activity: N/A  Heart Rate: N/A  bpm  MSD:   mm    w     d  CRL:    mm    w    d                  [place] EDC:  Subchorionic hemorrhage:  N/A  Maternal uterus/adnexae:  Centrally within the uterus, a dominant irregular fluid collection is seen with surrounding abnormal complex soft tissue/fluid, collection overall measuring 3.5 x 2.6 x 3.8 cm.  This could represent an intrauterine gestational sac with  extensive subchronic hemorrhage or gestational trophoblastic  disease.  No free pelvic fluid.  LEFT ovary normal size and morphology, 3.8 x 2.0 x 3.5 cm.  RIGHT ovary 4.9 x 5.1 x 3.8 cm and contains a large simple cyst 4.3 x 3.2 x 4.2 cm.  Trace free pelvic fluid.  No other adnexal masses.  IMPRESSION: Large simple RIGHT ovarian cyst 4.3 cm diameter.  Irregular dominant fluid collection centrally within uterus with extensive surrounding abnormal complex soft tissue and fluid collections, overall measuring 3.5 x 2.6 x 3.8 cm in size.  This could either represent an intrauterine gestational sac with surrounding extensive subchronic hemorrhage or gestational trophoblastic disease.  Correlation with current quantitative beta HCG recommended, as well as follow-up serial quantitative beta HCG and/or sonography.   Electronically Signed   By: Ulyses Southward M.D.   On: 02/02/2018 14:08      Assessment and Plan :Jackie Nelson is a 38 y.o. [id number] admitted for surgical management of missed abortion, concern for molar pregnancy. The risks of suction D&E were reviewed with the patient; including but not limited to: infection which may require antibiotics; bleeding which may require transfusion or re-operation; injury to bowel, bladder, ureters or other surrounding organs; need for additional procedures including hysterectomy in the event of a life-threatening hemorrhage; placental abnormalities wth subsequent pregnancies, thromboembolic phenomenon and other postoperative/anesthesia complications. Reviewed that all sampling will be sent to pathology and further management based on findings. Reviewed that if she has a molar pregnancy, she will require extensive follow up.  The patient concurred with the proposed plan, giving informed consent for the procedure. She is agreeable to a blood transfusion in the event of emergency.  Patient is NPO Anesthesia aware Preoperative prophylactic  antibiotics ordered SCDs  Admission labs To OR when ready   Interview done via [place] interpretor.    Baldemar Lenis, M.D. Attending Obstetrician & Gynecologist, [facility] for [employer], [facility]

## 2018-02-14 ENCOUNTER — Encounter (HOSPITAL_COMMUNITY): Payer: Self-pay | Admitting: Obstetrics and Gynecology

## 2018-03-16 DIAGNOSIS — Z029 Encounter for administrative examinations, unspecified: Secondary | ICD-10-CM

## 2018-03-18 ENCOUNTER — Encounter: Payer: Self-pay | Admitting: Obstetrics and Gynecology

## 2018-03-18 ENCOUNTER — Ambulatory Visit: Payer: Medicaid Other | Admitting: Obstetrics and Gynecology

## 2018-03-18 VITALS — Wt 171.6 lb

## 2018-03-18 DIAGNOSIS — Z3009 Encounter for other general counseling and advice on contraception: Secondary | ICD-10-CM

## 2018-03-18 DIAGNOSIS — Z9889 Other specified postprocedural states: Secondary | ICD-10-CM

## 2018-03-18 DIAGNOSIS — Z789 Other specified health status: Secondary | ICD-10-CM

## 2018-03-18 DIAGNOSIS — O039 Complete or unspecified spontaneous abortion without complication: Secondary | ICD-10-CM

## 2018-03-18 NOTE — Progress Notes (Signed)
   GYNECOLOGY OFFICE FOLLOW UP NOTE  History:  38 y.o. [id number] here today for follow up for D&E on 02/13/18 for SAB.  She has minimal stomach pain once in a while. She is eating, drinking normally. No nausea or vomiting. Regular bowel/bladder habits. Has had a period since and is otherwise doing well.   Interested in contraception.  History reviewed. No pertinent past medical history.  Past Surgical History:  Procedure Laterality Date  . DILATION AND EVACUATION N/A 02/13/2018   Procedure: DILATATION AND EVACUATION;  Surgeon: Conan Bowens, MD;  Location: [facility];  Service: Gynecology;  Laterality: N/A;    No current outpatient medications on file.  The following portions of the patient's history were reviewed and updated as appropriate: allergies, current medications, past family history, past medical history, past social history, past surgical history and problem list.   Review of Systems:  Pertinent items noted in HPI and remainder of comprehensive ROS otherwise negative.   Objective:  Physical Exam Wt 171 lb 9.6 oz (77.8 kg)   LMP 03/11/2018 (Exact Date)   Breastfeeding Unknown   BMI 27.70 kg/m   CONSTITUTIONAL: Well-developed, well-nourished female in no acute distress.  HENT:  Normocephalic, atraumatic. External right and left ear normal. Oropharynx is clear and moist EYES: Conjunctivae and EOM are normal. Pupils are equal, round, and reactive to light. No scleral icterus.  NECK: Normal range of motion, supple, no masses SKIN: Skin is warm and dry. No rash noted. Not diaphoretic. No erythema. No pallor. NEUROLOGIC: Alert and oriented to person, place, and time. Normal reflexes, muscle tone coordination. No cranial nerve deficit noted. PSYCHIATRIC: Normal mood and affect. Normal behavior. Normal judgment and thought content. CARDIOVASCULAR: Normal heart rate noted RESPIRATORY: Effort normal, no problems with respiration noted ABDOMEN: Soft, no distention noted.   PELVIC:  deferred MUSCULOSKELETAL: Normal range of motion. No edema noted.  Labs and Imaging Diagnosis Products of Conception - PRODUCTS OF CONCEPTION (CHORIONIC VILLI PRESENT). - MOLAR FEATURES ARE NOT SEEN. Valinda Hoar MD Pathologist, Electronic Signature (Case signed 02/16/2018)  Assessment & Plan:   1. Postoperative state Doing well, no issues  2. SAB (spontaneous abortion) Reviewed benign path  3. Encounter for counseling regarding contraception Reviewed LARCs as she states she cannot remember to take pills regularly, she is interested in [place], desires it done today. Had unprotected intercourse within the last week, reviewed risks of being pregnant, she will return for placement 2 weeks, reviewed need to have protected intercourse or no intercourse between now and then, she verbalizes understanding  4. Language barrier Live [place] interpretor used   Routine preventative health maintenance measures emphasized. Please refer to After Visit Summary for other counseling recommendations.   Return in about 2 weeks (around 04/01/2018) for nexplanon placement.    Baldemar Lenis, M.D. Attending Center for [employer] Midwife)

## 2018-03-25 ENCOUNTER — Other Ambulatory Visit: Payer: Self-pay

## 2018-03-25 ENCOUNTER — Ambulatory Visit (INDEPENDENT_AMBULATORY_CARE_PROVIDER_SITE_OTHER): Payer: PRIVATE HEALTH INSURANCE | Admitting: Nurse Practitioner

## 2018-03-25 ENCOUNTER — Encounter (INDEPENDENT_AMBULATORY_CARE_PROVIDER_SITE_OTHER): Payer: Self-pay | Admitting: Nurse Practitioner

## 2018-03-25 VITALS — BP 119/82 | HR 62 | Temp 98.1°F | Ht 63.5 in | Wt 163.4 lb

## 2018-03-25 DIAGNOSIS — M545 Low back pain, unspecified: Secondary | ICD-10-CM

## 2018-03-25 DIAGNOSIS — Z131 Encounter for screening for diabetes mellitus: Secondary | ICD-10-CM

## 2018-03-25 LAB — POCT GLYCOSYLATED HEMOGLOBIN (HGB A1C): Hemoglobin A1C: 5.4 % (ref 4.0–5.6)

## 2018-03-25 MED ORDER — IBUPROFEN 800 MG PO TABS: 800.0000 mg | ORAL_TABLET | Freq: Three times a day (TID) | ORAL | 0 refills | Status: DC | PRN

## 2018-03-25 MED ORDER — TIZANIDINE HCL 2 MG PO CAPS: 2.0000 mg | ORAL_CAPSULE | Freq: Three times a day (TID) | ORAL | 0 refills | Status: DC | PRN
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
## 2018-04-08 ENCOUNTER — Ambulatory Visit (INDEPENDENT_AMBULATORY_CARE_PROVIDER_SITE_OTHER): Payer: PRIVATE HEALTH INSURANCE | Admitting: Obstetrics and Gynecology

## 2018-04-08 ENCOUNTER — Other Ambulatory Visit (HOSPITAL_COMMUNITY)
Admission: RE | Admit: 2018-04-08 | Discharge: 2018-04-08 | Disposition: A | Discharge status: Home / Self Care | Payer: 59 | Source: Ambulatory Visit | Attending: Obstetrics and Gynecology | Admitting: Obstetrics and Gynecology

## 2018-04-08 ENCOUNTER — Encounter: Payer: Self-pay | Admitting: Obstetrics and Gynecology

## 2018-04-08 VITALS — BP 120/77 | HR 65 | Ht 63.0 in | Wt 170.0 lb

## 2018-04-08 DIAGNOSIS — Z3202 Encounter for pregnancy test, result negative: Secondary | ICD-10-CM | POA: Diagnosis not present

## 2018-04-08 DIAGNOSIS — Z124 Encounter for screening for malignant neoplasm of cervix: Secondary | ICD-10-CM | POA: Diagnosis present

## 2018-04-08 DIAGNOSIS — Z3049 Encounter for surveillance of other contraceptives: Secondary | ICD-10-CM

## 2018-04-08 DIAGNOSIS — Z30017 Encounter for initial prescription of implantable subdermal contraceptive: Secondary | ICD-10-CM

## 2018-04-08 LAB — POCT PREGNANCY, URINE: Preg Test, Ur: NEGATIVE

## 2018-04-08 MED ORDER — ETONOGESTREL 68 MG ~~LOC~~ IMPL
68.0000 mg | DRUG_IMPLANT | Freq: Once | SUBCUTANEOUS | Status: AC
  Administered 2018-04-08: 68 mg via SUBCUTANEOUS

## 2018-04-08 NOTE — Addendum Note (Signed)
Addended by: Gerome Apley on: 04/08/2018 04:37 PM   Modules accepted: Orders

## 2018-04-08 NOTE — Progress Notes (Signed)
    GYNECOLOGY OFFICE PROCEDURE NOTE  Corena HerterBerthy Hepner is a 38 y.o. G5P4010 here for Nexplanon insertion.  Last pap smear was done today.  No other gynecologic concerns. Denies unprotected intercourse within the last 14 days. UPT: negative  Reviewed risks of insertion of implant including risk of infection, bleeding, damage to surrounding tissues and organs, migration of implant, difficult removal. She verbalizes understanding and affirms desire to proceed. Consent signed.   Nexplanon Insertion Procedure Patient identified, informed consent performed, consent signed.   Patient does understand that irregular bleeding is a very common side effect of this medication. She was advised to have backup contraception for one week after placement. Pregnancy test in clinic today was negative.  An adequate timeout was performed.  Patient's left arm was prepped and draped in the usual sterile fashion. The ruler used to measure and mark insertion area.  Patient was prepped with alcohol swab and then injected with 3 ml of 1% lidocaine.  She was prepped with betadine, Nexplanon removed from packaging,  Device confirmed in needle, then inserted full length of needle and withdrawn per handbook instructions. Nexplanon was able to palpated in the patient's arm; patient palpated the insert herself. There was minimal blood loss.  Patient insertion site covered with guaze and a pressure bandage to reduce any bruising.  The patient tolerated the procedure well and was given post procedure instructions.   Pap smear completed today.    Device Info Exp: 08/2023 Lot#: [id number]   Interview and consent done via [place] translator.   Baldemar LenisK. Meryl Chandler Swiderski, M.D. Attending Center for [facility] Mid[facility](Faculty Practice)

## 2018-04-10 LAB — CYTOLOGY - PAP
Diagnosis: NEGATIVE
HPV: NOT DETECTED

## 2018-04-15 ENCOUNTER — Ambulatory Visit (INDEPENDENT_AMBULATORY_CARE_PROVIDER_SITE_OTHER): Payer: PRIVATE HEALTH INSURANCE | Admitting: Primary Care

## 2018-04-15 ENCOUNTER — Other Ambulatory Visit: Payer: Self-pay

## 2018-04-15 ENCOUNTER — Encounter (INDEPENDENT_AMBULATORY_CARE_PROVIDER_SITE_OTHER): Payer: Self-pay | Admitting: Primary Care

## 2018-04-15 VITALS — BP 120/79 | HR 58 | Temp 98.2°F | Ht 63.0 in | Wt 171.6 lb

## 2018-04-15 DIAGNOSIS — Z23 Encounter for immunization: Secondary | ICD-10-CM | POA: Diagnosis not present

## 2018-04-15 DIAGNOSIS — N390 Urinary tract infection, site not specified: Secondary | ICD-10-CM | POA: Diagnosis not present

## 2018-04-15 DIAGNOSIS — R350 Frequency of micturition: Secondary | ICD-10-CM

## 2018-04-15 DIAGNOSIS — M545 Low back pain, unspecified: Secondary | ICD-10-CM

## 2018-04-15 DIAGNOSIS — M62838 Other muscle spasm: Secondary | ICD-10-CM | POA: Diagnosis not present

## 2018-04-15 DIAGNOSIS — Z114 Encounter for screening for human immunodeficiency virus [HIV]: Secondary | ICD-10-CM

## 2018-04-15 LAB — POCT URINALYSIS DIP (CLINITEK)
Bilirubin, UA: NEGATIVE
Glucose, UA: NEGATIVE mg/dL
Ketones, POC UA: NEGATIVE mg/dL
Leukocytes, UA: NEGATIVE
Nitrite, UA: NEGATIVE
POC PROTEIN,UA: NEGATIVE
Spec Grav, UA: 1.02 (ref 1.010–1.025)
Urobilinogen, UA: 0.2 E.U./dL
pH, UA: 5 (ref 5.0–8.0)

## 2018-04-15 MED ORDER — METHOCARBAMOL 500 MG PO TABS: 500.0000 mg | ORAL_TABLET | Freq: Three times a day (TID) | ORAL | 0 refills | Status: DC

## 2018-04-15 MED ORDER — IBUPROFEN 800 MG PO TABS: 800.0000 mg | ORAL_TABLET | Freq: Three times a day (TID) | ORAL | 0 refills | Status: AC | PRN

## 2018-04-15 MED ORDER — FLUCONAZOLE 150 MG PO TABS: 150.0000 mg | ORAL_TABLET | Freq: Once | ORAL | 0 refills | Status: AC

## 2018-04-15 MED ORDER — SULFAMETHOXAZOLE-TRIMETHOPRIM 800-160 MG PO TABS: 1.0000 | ORAL_TABLET | Freq: Two times a day (BID) | ORAL | 0 refills | Status: DC

## 2018-04-15 NOTE — Progress Notes (Signed)
Established Patient Office Visit  Subjective:  Patient ID: Jackie Nelson, female    DOB: [birth year]  Age: 38 y.o. MRN: [id number]  CC:  Chief Complaint  Patient presents with  . Follow-up    back pain    HPI Jackie Nelson presents for low back pain and heavy menstrual cycles which increase pain. 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 No pertinent past medical history.  Past Surgical History:  Procedure Laterality Date  . DILATION AND EVACUATION N/A 02/13/2018   Procedure: DILATATION AND EVACUATION;  Surgeon: Conan Bowensavis, Kelly M, MD;  Location: [facility];  Service: Gynecology;  Laterality: N/A;    Family History  Problem Relation Age of Onset  . Diabetes Mother     Social History   Socioeconomic History  . Marital status: Married    Spouse name: Not on file  . Number of children: Not on file  . Years of education: Not on file  . Highest education level: Not on file  Occupational History  . Not on file  Social Needs  . Financial resource strain: Not on file  . Food insecurity:    Worry: Sometimes true    Inability: Sometimes true  . Transportation needs:    Medical: No    Non-medical: No  Tobacco Use  . Smoking status: Never Smoker  . Smokeless tobacco: Never Used  Substance and Sexual Activity  . Alcohol use: Never    Frequency: Never  . Drug use: Never  . Sexual activity: Yes    Birth control/protection: None    Comment: friday last week  Lifestyle  . Physical activity:    Days per week: Not on file    Minutes per session: Not on file  . Stress: Not on file  Relationships  . Social connections:    Talks on phone: Not on file    Gets together: Not on file    Attends religious service: Not on file    Active member of club or organization: Not on file    Attends meetings of clubs or organizations: Not on file    Relationship status: Not on file  .  Intimate partner violence:    Fear of current or ex partner: Not on file    Emotionally abused: Not on file    Physically abused: Not on file    Forced sexual activity: Not on file  Other Topics Concern  . Not on file  Social History Narrative  . Not on file    Outpatient Medications Prior to Visit  Medication Sig Dispense Refill  . ibuprofen (ADVIL,MOTRIN) 800 MG tablet Take 1 tablet (800 mg total) by mouth every 8 (eight) hours as needed for up to 30 days. 60 tablet 0  . tizanidine (ZANAFLEX) 2 MG capsule Take 1 capsule (2 mg total) by mouth 3 (three) times daily as needed for up to 30 days for muscle spasms. (Patient not taking: Reported on 04/08/2018) 60 capsule 0   No facility-administered medications prior to visit.     No Known Allergies  ROS Review of Systems  Constitutional: Negative.   HENT: Negative.   Eyes: Negative.   Respiratory: Negative.   Cardiovascular: Negative.   Gastrointestinal: Negative.   Endocrine: Negative.   Genitourinary: Positive for frequency.  Musculoskeletal: Positive for back pain.  Skin: Negative.   Allergic/Immunologic: Negative.   Neurological: Negative.   Hematological: Negative.  Psychiatric/Behavioral: Negative.       Objective:    Physical Exam  Constitutional: She is oriented to person, place, and time. She appears well-developed.  HENT:  Head: Normocephalic.  Eyes: Pupils are equal, round, and reactive to light.  Neck: Normal range of motion.  Cardiovascular: Normal rate.  Pulmonary/Chest: Effort normal and breath sounds normal.  Abdominal: Soft.  Musculoskeletal:        General: Tenderness present.  Neurological: She is oriented to person, place, and time.  Skin: Skin is warm.    BP 120/79 (BP Location: Right Arm, Patient Position: Sitting, Cuff Size: Large)   Pulse (!) 58   Temp 98.2 F (36.8 C) (Oral)   Ht 5\' 3"  (1.6 m)   Wt 171 lb 9.6 oz (77.8 kg)   LMP 04/06/2018 (Exact Date)   SpO2 99%   BMI 30.40 kg/m   Wt Readings from Last 3 Encounters:  04/15/18 171 lb 9.6 oz (77.8 kg)  04/08/18 170 lb (77.1 kg)  03/25/18 163 lb 6.4 oz (74.1 kg)     Health Maintenance Due  Topic Date Due  . HIV Screening  02/28/1995  . TETANUS/TDAP  02/28/1999  . INFLUENZA VACCINE  10/09/2017    There are no preventive care reminders to display for this patient.  No results found for: TSH Lab Results  Component Value Date   WBC 4.6 02/13/2018   HGB 12.0 02/13/2018   HCT 36.9 02/13/2018   MCV 84.4 02/13/2018   PLT 199 02/13/2018   Lab Results  Component Value Date   NA 136 02/13/2018   K 3.9 02/13/2018   CO2 26 02/13/2018   GLUCOSE 106 (H) 02/13/2018   BUN 17 02/13/2018   CREATININE 0.76 02/13/2018   CALCIUM 9.2 02/13/2018   ANIONGAP 7 02/13/2018   No results found for: CHOL No results found for: HDL No results found for: LDLCALC No results found for: TRIG No results found for: CHOLHDL Lab Results  Component Value Date   HGBA1C 5.4 03/25/2018      Assessment & Plan:   Problem List Items Addressed This Visit    None    Visit Diagnoses    Need for Tdap vaccination    -  Primary   Relevant Orders   Tdap vaccine greater than or equal to 7yo IM (Completed)   Urinary frequency       Relevant Orders   POCT URINALYSIS DIP (CLINITEK) (Completed)   Need for immunization against influenza       Relevant Orders   Flu Vaccine QUAD 36+ mos IM (Completed)   Screening for HIV (human immunodeficiency virus)       Relevant Orders   HIV Antibody (routine testing w rflx)   Urinary tract infection without hematuria, site unspecified       Relevant Medications   sulfamethoxazole-trimethoprim (BACTRIM DS,SEPTRA DS) 800-160 MG tablet   fluconazole (DIFLUCAN) 150 MG tablet   Other Relevant Orders   Urine Culture   Acute left-sided low back pain without sciatica       Relevant Medications   methocarbamol (ROBAXIN) 500 MG tablet   ibuprofen (ADVIL,MOTRIN) 800 MG tablet   Muscle spasm          Keiran was seen today for follow-up.  Diagnoses and all orders for this visit:  Need for Tdap vaccination -     Tdap vaccine greater than or equal to 7yo IM  Urinary frequency -     POCT URINALYSIS DIP (CLINITEK)  Need for  immunization against influenza -     Flu Vaccine QUAD 36+ mos IM  Screening for HIV (human immunodeficiency virus) -     HIV Antibody (routine testing w rflx)  Urinary tract infection without hematuria, site unspecified -     Urine Culture positive for leucocytes . TX with Septra DS and diflucan prophylaxis    Acute left-sided low back pain without sciatica -     ibuprofen (ADVIL,MOTRIN) 800 MG tablet; Take 1 tablet (800 mg total) by mouth every 8 (eight) hours as needed for up to 30 days.  Muscle spasm tx  Other orders -     methocarbamol (ROBAXIN) 500 MG tablet; Take 1 tablet (500 mg total) by mouth 3 (three) times daily. -     sulfamethoxazole-trimethoprim (BACTRIM DS,SEPTRA DS) 800-160 MG tablet; Take 1 tablet by mouth 2 (two) times daily. -     fluconazole (DIFLUCAN) 150 MG tablet; Take 1 tablet (150 mg total) by mouth once for 1 dose.   Meds ordered this encounter  Medications  . methocarbamol (ROBAXIN) 500 MG tablet    Sig: Take 1 tablet (500 mg total) by mouth 3 (three) times daily.    Dispense:  90 tablet    Refill:  0  . sulfamethoxazole-trimethoprim (BACTRIM DS,SEPTRA DS) 800-160 MG tablet    Sig: Take 1 tablet by mouth 2 (two) times daily.    Dispense:  14 tablet    Refill:  0  . fluconazole (DIFLUCAN) 150 MG tablet    Sig: Take 1 tablet (150 mg total) by mouth once for 1 dose.    Dispense:  1 tablet    Refill:  0  . ibuprofen (ADVIL,MOTRIN) 800 MG tablet    Sig: Take 1 tablet (800 mg total) by mouth every 8 (eight) hours as needed for up to 30 days.    Dispense:  60 tablet    Refill:  0    Follow-up:  PRN   Grayce Sessions, NP

## 2018-04-16 LAB — HIV ANTIBODY (ROUTINE TESTING W REFLEX): HIV Screen 4th Generation wRfx: NONREACTIVE

## 2018-04-17 LAB — URINE CULTURE

## 2018-06-17 ENCOUNTER — Other Ambulatory Visit: Payer: Self-pay

## 2018-06-17 ENCOUNTER — Ambulatory Visit: Payer: 59 | Attending: Primary Care | Admitting: Primary Care

## 2018-06-17 DIAGNOSIS — R51 Headache: Secondary | ICD-10-CM

## 2018-06-17 DIAGNOSIS — R05 Cough: Secondary | ICD-10-CM | POA: Diagnosis not present

## 2018-06-17 DIAGNOSIS — K047 Periapical abscess without sinus: Secondary | ICD-10-CM | POA: Diagnosis not present

## 2018-06-17 DIAGNOSIS — K029 Dental caries, unspecified: Secondary | ICD-10-CM | POA: Diagnosis not present

## 2018-06-17 MED ORDER — AMOXICILLIN-POT CLAVULANATE 875-125 MG PO TABS: 1.0000 | ORAL_TABLET | Freq: Two times a day (BID) | ORAL | 0 refills | Status: DC

## 2018-06-17 MED ORDER — IBUPROFEN 600 MG PO TABS: 600.0000 mg | ORAL_TABLET | Freq: Three times a day (TID) | ORAL | 1 refills | Status: DC | PRN

## 2018-06-17 NOTE — Progress Notes (Signed)
Acute Office Visit  Subjective:    Patient ID: Jackie Nelson, female    DOB: [birth year], 38 y.o.   MRN: [id number]  Chief Complaint  Patient presents with  . Cough    Dental Pain   This is a new problem. The current episode started 1 to 4 weeks ago. The problem occurs constantly. The problem has been gradually worsening. The pain is at a severity of 10/10. The pain is severe. Associated symptoms include difficulty swallowing and facial pain. She has tried nothing for the symptoms. The treatment provided no relief.   Patient is in today for tooth pain  No past medical history on file.  Past Surgical History:  Procedure Laterality Date  . DILATION AND EVACUATION N/A 02/13/2018   Procedure: DILATATION AND EVACUATION;  Surgeon: Conan Bowens, MD;  Location: [facility];  Service: Gynecology;  Laterality: N/A;    Family History  Problem Relation Age of Onset  . Diabetes Mother     Social History   Socioeconomic History  . Marital status: Married    Spouse name: Not on file  . Number of children: Not on file  . Years of education: Not on file  . Highest education level: Not on file  Occupational History  . Not on file  Social Needs  . Financial resource strain: Not on file  . Food insecurity:    Worry: Sometimes true    Inability: Sometimes true  . Transportation needs:    Medical: No    Non-medical: No  Tobacco Use  . Smoking status: Never Smoker  . Smokeless tobacco: Never Used  Substance and Sexual Activity  . Alcohol use: Never    Frequency: Never  . Drug use: Never  . Sexual activity: Yes    Birth control/protection: None    Comment: friday last week  Lifestyle  . Physical activity:    Days per week: Not on file    Minutes per session: Not on file  . Stress: Not on file  Relationships  . Social connections:    Talks on phone: Not on file    Gets together: Not on file    Attends religious service: Not on file    Active member of club or organization: Not  on file    Attends meetings of clubs or organizations: Not on file    Relationship status: Not on file  . Intimate partner violence:    Fear of current or ex partner: Not on file    Emotionally abused: Not on file    Physically abused: Not on file    Forced sexual activity: Not on file  Other Topics Concern  . Not on file  Social History Narrative  . Not on file    Outpatient Medications Prior to Visit  Medication Sig Dispense Refill  . methocarbamol (ROBAXIN) 500 MG tablet Take 1 tablet (500 mg total) by mouth 3 (three) times daily. (Patient not taking: Reported on 06/17/2018) 90 tablet 0  . sulfamethoxazole-trimethoprim (BACTRIM DS,SEPTRA DS) 800-160 MG tablet Take 1 tablet by mouth 2 (two) times daily. (Patient not taking: Reported on 06/17/2018) 14 tablet 0   No facility-administered medications prior to visit.     No Known Allergies  Review of Systems  HENT:       Back molar right side lower jaw hole   Respiratory: Positive for cough.   Cardiovascular: Negative.   Gastrointestinal: Negative.   Genitourinary: Negative.   Musculoskeletal: Negative.   Neurological: Positive for headaches.  Psychiatric/Behavioral: Negative.        Objective:    There were no vitals taken for this visit. Wt Readings from Last 3 Encounters:  04/15/18 171 lb 9.6 oz (77.8 kg)  04/08/18 170 lb (77.1 kg)  03/25/18 163 lb 6.4 oz (74.1 kg)    There are no preventive care reminders to display for this patient.  There are no preventive care reminders to display for this patient.   No results found for: TSH Lab Results  Component Value Date   WBC 4.6 02/13/2018   HGB 12.0 02/13/2018   HCT 36.9 02/13/2018   MCV 84.4 02/13/2018   PLT 199 02/13/2018   Lab Results  Component Value Date   NA 136 02/13/2018   K 3.9 02/13/2018   CO2 26 02/13/2018   GLUCOSE 106 (H) 02/13/2018   BUN 17 02/13/2018   CREATININE 0.76 02/13/2018   CALCIUM 9.2 02/13/2018   ANIONGAP 7 02/13/2018   No results  found for: CHOL No results found for: HDL No results found for: LDLCALC No results found for: TRIG No results found for: CHOLHDL Lab Results  Component Value Date   HGBA1C 5.4 03/25/2018   Jackie Nelson was seen today for cough.  Diagnoses and all orders for this visit:  Infected dental carries -     amoxicillin-clavulanate (AUGMENTIN) 875-125 MG tablet; Take 1 tablet by mouth 2 (two) times daily. -     Ambulatory referral to Dentistry -     ibuprofen (ADVIL,MOTRIN) 600 MG tablet; Take 1 tablet (600 mg total) by mouth every 8 (eight) hours as needed.      Assessment & Plan:   Problem List Items Addressed This Visit    None       No orders of the defined types were placed in this encounter.    Grayce SessionsMichelle P Edwards, NP

## 2018-06-22 ENCOUNTER — Telehealth: Payer: Self-pay | Admitting: Emergency Medicine

## 2018-06-22 ENCOUNTER — Encounter: Payer: Self-pay | Admitting: Emergency Medicine

## 2018-06-22 NOTE — Telephone Encounter (Signed)
Patients call taken.  Patient identified by name and date of birth.  Patient states she needs a note to return to work.

## 2018-06-22 NOTE — Telephone Encounter (Signed)
Patients call returned.  Patient identified by name and date of birth.  Patient told that letter she wishes is at the front door per her request to pick it up.

## 2018-06-22 NOTE — Telephone Encounter (Signed)
Patient has a note in her chart stating she can return to work that was completed by Marcelino Duster last week. Thanks She can pick the letter up once it's printed and signed by Marcelino Duster

## 2019-04-19 ENCOUNTER — Encounter: Payer: Self-pay | Admitting: Family Medicine

## 2019-04-19 ENCOUNTER — Other Ambulatory Visit: Payer: Self-pay

## 2019-04-19 ENCOUNTER — Ambulatory Visit (INDEPENDENT_AMBULATORY_CARE_PROVIDER_SITE_OTHER): Payer: Medicaid Other | Admitting: Family Medicine

## 2019-04-19 DIAGNOSIS — Z01419 Encounter for gynecological examination (general) (routine) without abnormal findings: Secondary | ICD-10-CM

## 2019-04-19 DIAGNOSIS — Z124 Encounter for screening for malignant neoplasm of cervix: Secondary | ICD-10-CM

## 2019-04-19 DIAGNOSIS — Z0000 Encounter for general adult medical examination without abnormal findings: Secondary | ICD-10-CM

## 2019-04-19 NOTE — Patient Instructions (Signed)
 Preventive Care 21-39 Years Old, Female Preventive care refers to visits with your health care provider and lifestyle choices that can promote health and wellness. This includes:  A yearly physical exam. This may also be called an annual well check.  Regular dental visits and eye exams.  Immunizations.  Screening for certain conditions.  Healthy lifestyle choices, such as eating a healthy diet, getting regular exercise, not using drugs or products that contain nicotine and tobacco, and limiting alcohol use. What can I expect for my preventive care visit? Physical exam Your health care provider will check your:  Height and weight. This may be used to calculate body mass index (BMI), which tells if you are at a healthy weight.  Heart rate and blood pressure.  Skin for abnormal spots. Counseling Your health care provider may ask you questions about your:  Alcohol, tobacco, and drug use.  Emotional well-being.  Home and relationship well-being.  Sexual activity.  Eating habits.  Work and work environment.  Method of birth control.  Menstrual cycle.  Pregnancy history. What immunizations do I need?  Influenza (flu) vaccine  This is recommended every year. Tetanus, diphtheria, and pertussis (Tdap) vaccine  You may need a Td booster every 10 years. Varicella (chickenpox) vaccine  You may need this if you have not been vaccinated. Human papillomavirus (HPV) vaccine  If recommended by your health care provider, you may need three doses over 6 months. Measles, mumps, and rubella (MMR) vaccine  You may need at least one dose of MMR. You may also need a second dose. Meningococcal conjugate (MenACWY) vaccine  One dose is recommended if you are age 19-21 years and a first-year college student living in a residence hall, or if you have one of several medical conditions. You may also need additional booster doses. Pneumococcal conjugate (PCV13) vaccine  You may need  this if you have certain conditions and were not previously vaccinated. Pneumococcal polysaccharide (PPSV23) vaccine  You may need one or two doses if you smoke cigarettes or if you have certain conditions. Hepatitis A vaccine  You may need this if you have certain conditions or if you travel or work in places where you may be exposed to hepatitis A. Hepatitis B vaccine  You may need this if you have certain conditions or if you travel or work in places where you may be exposed to hepatitis B. Haemophilus influenzae type b (Hib) vaccine  You may need this if you have certain conditions. You may receive vaccines as individual doses or as more than one vaccine together in one shot (combination vaccines). Talk with your health care provider about the risks and benefits of combination vaccines. What tests do I need?  Blood tests  Lipid and cholesterol levels. These may be checked every 5 years starting at age 20.  Hepatitis C test.  Hepatitis B test. Screening  Diabetes screening. This is done by checking your blood sugar (glucose) after you have not eaten for a while (fasting).  Sexually transmitted disease (STD) testing.  BRCA-related cancer screening. This may be done if you have a family history of breast, ovarian, tubal, or peritoneal cancers.  Pelvic exam and Pap test. This may be done every 3 years starting at age 21. Starting at age 30, this may be done every 5 years if you have a Pap test in combination with an HPV test. Talk with your health care provider about your test results, treatment options, and if necessary, the need for more   tests. Follow these instructions at home: Eating and drinking   Eat a diet that includes fresh fruits and vegetables, whole grains, lean protein, and low-fat dairy.  Take vitamin and mineral supplements as recommended by your health care provider.  Do not drink alcohol if: ? Your health care provider tells you not to drink. ? You are  pregnant, may be pregnant, or are planning to become pregnant.  If you drink alcohol: ? Limit how much you have to 0-1 drink a day. ? Be aware of how much alcohol is in your drink. In the [place] one drink equals one 12 oz bottle of beer (355 mL), one 5 oz glass of wine (148 mL), or one 1 oz glass of hard liquor (44 mL). Lifestyle  Take daily care of your teeth and gums.  Stay active. Exercise for at least 30 minutes on 5 or more days each week.  Do not use any products that contain nicotine or tobacco, such as cigarettes, e-cigarettes, and chewing tobacco. If you need help quitting, ask your health care provider.  If you are sexually active, practice safe sex. Use a condom or other form of birth control (contraception) in order to prevent pregnancy and STIs (sexually transmitted infections). If you plan to become pregnant, see your health care provider for a preconception visit. What's next?  Visit your health care provider once a year for a well check visit.  Ask your health care provider how often you should have your eyes and teeth checked.  Stay up to date on all vaccines. This information is not intended to replace advice given to you by your health care provider. Make sure you discuss any questions you have with your health care provider. Document Revised: 11/06/2017 Document Reviewed: 11/06/2017 Elsevier Patient Education  2020 Elsevier Inc.  

## 2019-04-19 NOTE — Progress Notes (Signed)
  Subjective:     Jackie Nelson is a 39 y.o. female and is here for a comprehensive physical exam. The patient reports problems - irregular bleeding with her Nexplanon.     The following portions of the patient's history were reviewed and updated as appropriate: allergies, current medications, past family history, past medical history, past social history, past surgical history and problem list.  Review of Systems Pertinent items noted in HPI and remainder of comprehensive ROS otherwise negative.   Objective:    BP 140/83   Pulse 73   Wt 179 lb 3.2 oz (81.3 kg)   BMI 31.74 kg/m  General appearance: alert, cooperative and appears stated age Head: Normocephalic, without obvious abnormality, atraumatic Neck: no adenopathy, supple, symmetrical, trachea midline and thyroid not enlarged, symmetric, no tenderness/mass/nodules Lungs: clear to auscultation bilaterally Breasts: normal appearance, no masses or tenderness Heart: regular rate and rhythm, S1, S2 normal, no murmur, click, rub or gallop Abdomen: soft, non-tender; bowel sounds normal; no masses,  no organomegaly Pelvic: external genitalia normal, no adnexal masses or tenderness, no cervical motion tenderness, uterus normal size, shape, and consistency and vagina normal without discharge Extremities: extremities normal, atraumatic, no cyanosis or edema Pulses: 2+ and symmetric Skin: Skin color, texture, turgor normal. No rashes or lesions Lymph nodes: Cervical, supraclavicular, and axillary nodes normal. Neurologic: Grossly normal    Assessment:    Healthy female exam.      Plan:      Problem List Items Addressed This Visit    None    Visit Diagnoses    Screening for malignant neoplasm of cervix       Encounter for gynecological examination without abnormal finding         Does not need pap-->WNL 03/2018 Return in 2 weeks (on 05/03/2019) for in person, Nexplanon removal.  See After Visit Summary for Counseling  Recommendations

## 2019-04-19 NOTE — Progress Notes (Signed)
[employer] # [phone]

## 2019-05-10 ENCOUNTER — Encounter: Payer: Self-pay | Admitting: Family Medicine

## 2019-05-10 ENCOUNTER — Other Ambulatory Visit: Payer: Self-pay

## 2019-05-10 ENCOUNTER — Ambulatory Visit (INDEPENDENT_AMBULATORY_CARE_PROVIDER_SITE_OTHER): Payer: 59 | Admitting: Family Medicine

## 2019-05-10 VITALS — BP 130/79 | HR 78 | Ht 62.0 in | Wt 184.0 lb

## 2019-05-10 DIAGNOSIS — Z3046 Encounter for surveillance of implantable subdermal contraceptive: Secondary | ICD-10-CM

## 2019-05-10 NOTE — Progress Notes (Signed)
   Subjective:    Patient ID: Jackie Nelson is a 39 y.o. female presenting with nexplanon removal  on 05/10/2019  HPI: Has irregular bleeding with Nexplanon, desires removal. Nexplanon in since 03/2018. Does not desire further contraception.  Review of Systems  Constitutional: Negative for chills and fever.  Respiratory: Negative for shortness of breath.   Cardiovascular: Negative for chest pain.  Gastrointestinal: Negative for abdominal pain, nausea and vomiting.  Genitourinary: Negative for dysuria.  Skin: Negative for rash.      Objective:    BP 130/79   Pulse 78   Ht 5\' 2"  (1.575 m)   Wt 184 lb (83.5 kg)   LMP 04/26/2019 (Within Days)   BMI 33.65 kg/m  Physical Exam Constitutional:      General: She is not in acute distress.    Appearance: She is well-developed.  HENT:     Head: Normocephalic and atraumatic.  Eyes:     General: No scleral icterus. Cardiovascular:     Rate and Rhythm: Normal rate.  Pulmonary:     Effort: Pulmonary effort is normal.  Abdominal:     Palpations: Abdomen is soft.  Musculoskeletal:     Cervical back: Neck supple.  Skin:    General: Skin is warm and dry.  Neurological:     Mental Status: She is alert and oriented to person, place, and time.     Procedure:   Patient given informed consent for removal of her Nexplanon, time out was performed.  Signed copy in the chart.  Appropriate time out taken. Nexplanon site identified.  Area prepped in usual sterile fashon. One cc of 1% lidocaine was used to anesthetize the area at the distal end of the implant. A small stab incision was made right beside the implant on the distal portion.  The Nexplanon rod was grasped using hemostats and removed without difficulty.  There was less than 3 cc blood loss. There were no complications.  A small amount of antibiotic ointment and steri-strips were applied over the small incision.  A pressure bandage was applied to reduce any bruising.  The patient tolerated  the procedure well and was given post procedure instructions.  Assessment & Plan:   Nexplanon removal - removed easily   Return if symptoms worsen or fail to improve.  04/28/2019 05/10/2019 9:04 AM  .

## 2019-05-10 NOTE — Patient Instructions (Signed)

## 2019-06-22 ENCOUNTER — Telehealth (INDEPENDENT_AMBULATORY_CARE_PROVIDER_SITE_OTHER): Payer: Medicaid Other | Admitting: Family Medicine

## 2019-06-22 DIAGNOSIS — R109 Unspecified abdominal pain: Secondary | ICD-10-CM

## 2019-06-22 NOTE — Telephone Encounter (Signed)
Patient called in stating that she needs an appointment for abdominal pain. Patient was given an appointment for 5/13 @ 1:15. Patient stated that this appointment is too far out and she needs some medicine until she can come for her appointment. Patient instructed that a message would be sent to the nurses and they will contact her on what they can do until she comes for her appointment. Patient verbalized understanding. Message sent to clinical pool.

## 2019-06-24 NOTE — Telephone Encounter (Signed)
Called pt with [facility] interpreter ID [phone]. VM left stating I am returning patient's call and if she still has any questions or concerns she may call the office. Callback number given. Called mobile number; spoke with pt's husband. Explained I am calling for [facility], he states she is not at home. Asked pt's husband to let her know that I called and to let her know she may call us back with any questions or concerns. Pt's husband verbalized understanding.

## 2019-07-05 ENCOUNTER — Encounter: Payer: Self-pay | Admitting: *Deleted

## 2019-07-22 ENCOUNTER — Ambulatory Visit: Payer: Medicaid Other | Admitting: Family Medicine

## 2019-08-25 ENCOUNTER — Ambulatory Visit: Payer: Medicaid Other | Admitting: Obstetrics and Gynecology

## 2019-08-27 ENCOUNTER — Ambulatory Visit (INDEPENDENT_AMBULATORY_CARE_PROVIDER_SITE_OTHER): Payer: Medicaid Other | Admitting: Obstetrics and Gynecology

## 2019-08-27 ENCOUNTER — Encounter: Payer: Self-pay | Admitting: Obstetrics and Gynecology

## 2019-08-27 ENCOUNTER — Other Ambulatory Visit: Payer: Self-pay

## 2019-08-27 VITALS — BP 146/89 | HR 79 | Ht 64.0 in | Wt 185.8 lb

## 2019-08-27 DIAGNOSIS — Z3201 Encounter for pregnancy test, result positive: Secondary | ICD-10-CM

## 2019-08-27 DIAGNOSIS — R109 Unspecified abdominal pain: Secondary | ICD-10-CM

## 2019-08-27 DIAGNOSIS — N911 Secondary amenorrhea: Secondary | ICD-10-CM | POA: Diagnosis not present

## 2019-08-27 DIAGNOSIS — Z789 Other specified health status: Secondary | ICD-10-CM

## 2019-08-27 LAB — POCT PREGNANCY, URINE: Preg Test, Ur: POSITIVE — AB

## 2019-08-27 NOTE — Progress Notes (Signed)
Pain on left side of abdomen Since April

## 2019-08-27 NOTE — Addendum Note (Signed)
Addended by: Leroy Libman on: 08/27/2019 11:45 AM   Modules accepted: Orders

## 2019-08-27 NOTE — Progress Notes (Addendum)
   GYNECOLOGY OFFICE NOTE  History:  39 y.o. [id number] here today for pain in left side of abdomen since April. Had constant pain and then started to come and go feeling like stabbing. Takes ibuprofen for it. Was regular and now comes and goes irregularly. Occurs around 1-2x week. When she is laying down, the pain goes away quickly. When she is working, it can stay the whole day. Rates it 10/10 when it happens. Starts at left abdomen and radiates to her back. Has not had period since Nexplanon removed. Pain not related to periods. Urination is slightly difficult when she has this pain and color of urine is off after she has this pain. Has regular bowel movements.   History reviewed. No pertinent past medical history.  Past Surgical History:  Procedure Laterality Date  . DILATION AND EVACUATION N/A 02/13/2018   Procedure: DILATATION AND EVACUATION;  Surgeon: Conan Bowens, MD;  Location: [facility];  Service: Gynecology;  Laterality: N/A;    No current outpatient medications on file.  The following portions of the patient's history were reviewed and updated as appropriate: allergies, current medications, past family history, past medical history, past social history, past surgical history and problem list.   Review of Systems:  Pertinent items noted in HPI and remainder of comprehensive ROS otherwise negative.   Objective:  Physical Exam BP (!) 146/89   Pulse 79   Ht 5\' 4"  (1.626 m)   Wt 185 lb 12.8 oz (84.3 kg)   BMI 31.89 kg/m  CONSTITUTIONAL: Well-developed, well-nourished female in no acute distress.  HENT:  Normocephalic, atraumatic. External right and left ear normal. Oropharynx is clear and moist EYES: Conjunctivae and EOM are normal. Pupils are equal, round, and reactive to light. No scleral icterus.  NECK: Normal range of motion, supple, no masses SKIN: Skin is warm and dry. No rash noted. Not diaphoretic. No erythema. No pallor. NEUROLOGIC: Alert and oriented to person, place,  and time. Normal reflexes, muscle tone coordination. No cranial nerve deficit noted. PSYCHIATRIC: Normal mood and affect. Normal behavior. Normal judgment and thought content. CARDIOVASCULAR: Normal heart rate noted RESPIRATORY: Effort normal, no problems with respiration noted ABDOMEN: Soft, no distention noted.  Moderately tender throughout abdomen, increased tenderness on left mid abdomen, no back pain/CVA tenderness PELVIC: deferred MUSCULOSKELETAL: Normal range of motion. No edema noted.   Labs and Imaging No results found.  Assessment & Plan:   1. Abdominal pain, unspecified abdominal location Colicky, intermittent pain radiating to left flank, unrelated to periods, 10/10 pain suspicious for kidney stone - encouraged her to drink plenty of fluids and take pain medication prn - renal - Urine Culture - Urinalysis, Routine w reflex microscopic - to nephro if has kidney stone  2. Language barrier [place] interpretor  3. Flank pain - [place] RENAL; Future  4. Amenorrhea, secondary S/p nexplanon removal in 3/21 UPT: positive - reviewed positive result with patient, will get 4/21 for dating, to stop ibuprofen and start tylenol as needed   Routine preventative health maintenance measures emphasized. Please refer to After Visit Summary for other counseling recommendations.   Return in about 4 weeks (around 09/24/2019).  Total face-to-face time with patient: 26 minutes. Over 50% of encounter was spent on counseling and coordination of care.  09/26/2019, M.D. Attending [facility] [employer])

## 2019-08-28 LAB — URINALYSIS, ROUTINE W REFLEX MICROSCOPIC
Bilirubin, UA: NEGATIVE
Glucose, UA: NEGATIVE
Ketones, UA: NEGATIVE
Nitrite, UA: NEGATIVE
Specific Gravity, UA: 1.019 (ref 1.005–1.030)
Urobilinogen, Ur: 0.2 mg/dL (ref 0.2–1.0)
pH, UA: 5.5 (ref 5.0–7.5)

## 2019-08-28 LAB — MICROSCOPIC EXAMINATION
Bacteria, UA: NONE SEEN
Casts: NONE SEEN /lpf
WBC, UA: >30 /hpf — AB (ref 0–5)

## 2019-08-31 LAB — URINE CULTURE

## 2019-09-01 ENCOUNTER — Telehealth: Payer: Self-pay

## 2019-09-01 MED ORDER — CEFADROXIL 500 MG PO CAPS: 500.0000 mg | ORAL_CAPSULE | Freq: Two times a day (BID) | ORAL | 0 refills | Status: DC

## 2019-09-01 NOTE — Addendum Note (Signed)
Addended by: Leroy Libman on: 09/01/2019 01:48 PM   Modules accepted: Orders

## 2019-09-01 NOTE — Telephone Encounter (Signed)
Spoke to patient and advised her of UTI and to ppick up antibiotics at her pharmacy. she verbalized understanding and ended the call.

## 2019-09-01 NOTE — Telephone Encounter (Signed)
-----   Message from Conan Bowens, MD sent at 09/01/2019  1:48 PM EDT ----- Please call and let patient know about UTI, antibiotics sent to pharmacy.  [place] speaking.

## 2019-09-06 ENCOUNTER — Ambulatory Visit
Admission: RE | Admit: 2019-09-06 | Discharge: 2019-09-06 | Disposition: A | Discharge status: Home / Self Care | Payer: Medicaid Other | Source: Ambulatory Visit | Attending: Obstetrics and Gynecology | Admitting: Obstetrics and Gynecology

## 2019-09-06 ENCOUNTER — Ambulatory Visit (INDEPENDENT_AMBULATORY_CARE_PROVIDER_SITE_OTHER): Payer: Medicaid Other | Admitting: Obstetrics and Gynecology

## 2019-09-06 ENCOUNTER — Other Ambulatory Visit: Payer: Self-pay

## 2019-09-06 ENCOUNTER — Encounter: Payer: Self-pay | Admitting: Obstetrics and Gynecology

## 2019-09-06 VITALS — BP 135/70 | HR 66 | Ht 64.0 in | Wt 185.0 lb

## 2019-09-06 DIAGNOSIS — N1330 Unspecified hydronephrosis: Secondary | ICD-10-CM

## 2019-09-06 DIAGNOSIS — R93422 Abnormal radiologic findings on diagnostic imaging of left kidney: Secondary | ICD-10-CM

## 2019-09-06 DIAGNOSIS — R399 Unspecified symptoms and signs involving the genitourinary system: Secondary | ICD-10-CM

## 2019-09-06 DIAGNOSIS — N911 Secondary amenorrhea: Secondary | ICD-10-CM | POA: Insufficient documentation

## 2019-09-06 DIAGNOSIS — R109 Unspecified abdominal pain: Secondary | ICD-10-CM | POA: Insufficient documentation

## 2019-09-06 DIAGNOSIS — Z712 Person consulting for explanation of examination or test findings: Secondary | ICD-10-CM | POA: Diagnosis not present

## 2019-09-06 DIAGNOSIS — O021 Missed abortion: Secondary | ICD-10-CM | POA: Diagnosis not present

## 2019-09-06 NOTE — Progress Notes (Signed)
Interpreter Jocelyne present for encounter. Renal and OB US results discussed with Dr. Donavan Foil. He will see pt today and discuss plan of care w/pt.

## 2019-09-06 NOTE — Progress Notes (Signed)
Referral placed to [facility] per Donavan Foil, MD. Spoke with triage nurse, who reviewed [place] results with a provider. Appt scheduled for 10AM tomorrow, 09/07/19. Pt to arrive at 0945.   Called pt with [facility] interpreter Aram Beecham ID [phone]. VM left stating pt has new pt appt at [facility] tomorrow morning. Appt time and location given. Called patient contact, phone is no longer in service. Called pt's number a second time, no answer.   Fleet Contras RN 09/06/19

## 2019-09-06 NOTE — H&P (View-Only) (Signed)
   Subjective:    Patient ID: Jackie Nelson, female    DOB: 05/02/1980, 38 y.o.   MRN: [id number]  Pt was seen as a nursing visit which was converted to a physician visit.  U/S findings showed a nonviable 9 week IUP with enlarged gestational sac and abnormal placenta.  Pt had renal U/S which showed left kidney with no blood flow and hydronephrosis.  At 9-[redacted] weeks gestation the uterus is not large enough to cause hydronephrosis.  The pt also noted she has had left sided flank pain for greater than one year.     Review of Systems  Constitutional: Negative.   HENT: Negative.   Eyes: Negative.   Respiratory: Negative.   Cardiovascular: Negative.   Gastrointestinal: Negative.   Endocrine: Negative.   Genitourinary: Positive for difficulty urinating and flank pain.  Allergic/Immunologic: Negative.   Neurological: Negative.   Hematological: Negative.   Psychiatric/Behavioral:       Pt was appropriately upset and did not reciprocate discussion       Objective:   Physical Exam n/a       Assessment & Plan:   1. Encounter to discuss test results See note 2. Hydronephrosis determined by ultrasound - CBC - Basic Metabolic Panel (BMET) - Ambulatory referral to Urology  3. Abnormal finding of kidney  - CBC - Basic Metabolic Panel (BMET) - Ambulatory referral to Urology  4. Missed abortion Will schedule for suction D and E.  May need u/s guidance.  U/S findings are suspicious for molar pregnancy and increased risk of hemorrhage during the procedure.  5. Abnormal ultrasound of left kidney See note  Total face-to-face time with patient: 25 minutes. Over 50% of encounter was spent on counseling and coordination of care.    .  

## 2019-09-06 NOTE — Progress Notes (Signed)
   Subjective:    Patient ID: Jackie Nelson, female    DOB: 09-10-1980, 39 y.o.   MRN: [id number]  Pt was seen as a nursing visit which was converted to a physician visit.  U/S findings showed a nonviable 9 week IUP with enlarged gestational sac and abnormal placenta.  Pt had renal U/S which showed left kidney with no blood flow and hydronephrosis.  At 9-[redacted] weeks gestation the uterus is not large enough to cause hydronephrosis.  The pt also noted she has had left sided flank pain for greater than one year.     Review of Systems  Constitutional: Negative.   HENT: Negative.   Eyes: Negative.   Respiratory: Negative.   Cardiovascular: Negative.   Gastrointestinal: Negative.   Endocrine: Negative.   Genitourinary: Positive for difficulty urinating and flank pain.  Allergic/Immunologic: Negative.   Neurological: Negative.   Hematological: Negative.   Psychiatric/Behavioral:       Pt was appropriately upset and did not reciprocate discussion       Objective:   Physical Exam n/a       Assessment & Plan:   1. Encounter to discuss test results See note 2. Hydronephrosis determined by ultrasound - CBC - Basic Metabolic Panel (BMET) - Ambulatory referral to Urology  3. Abnormal finding of kidney  - CBC - Basic Metabolic Panel (BMET) - Ambulatory referral to Urology  4. Missed abortion Will schedule for suction D and E.  May need u/s guidance.  U/S findings are suspicious for molar pregnancy and increased risk of hemorrhage during the procedure.  5. Abnormal ultrasound of left kidney See note  Total face-to-face time with patient: 25 minutes. Over 50% of encounter was spent on counseling and coordination of care.    Marland Kitchen

## 2019-09-07 ENCOUNTER — Other Ambulatory Visit: Payer: Self-pay

## 2019-09-07 ENCOUNTER — Encounter (HOSPITAL_COMMUNITY): Payer: Self-pay | Admitting: Obstetrics & Gynecology

## 2019-09-07 DIAGNOSIS — R1084 Generalized abdominal pain: Secondary | ICD-10-CM | POA: Diagnosis not present

## 2019-09-07 DIAGNOSIS — N130 Hydronephrosis with ureteropelvic junction obstruction: Secondary | ICD-10-CM | POA: Diagnosis not present

## 2019-09-07 DIAGNOSIS — N261 Atrophy of kidney (terminal): Secondary | ICD-10-CM | POA: Diagnosis not present

## 2019-09-07 DIAGNOSIS — N1330 Unspecified hydronephrosis: Secondary | ICD-10-CM | POA: Diagnosis not present

## 2019-09-07 DIAGNOSIS — N390 Urinary tract infection, site not specified: Secondary | ICD-10-CM | POA: Diagnosis not present

## 2019-09-07 LAB — CBC
Hematocrit: 37.6 % (ref 34.0–46.6)
Hemoglobin: 12.2 g/dL (ref 11.1–15.9)
MCH: 27.5 pg (ref 26.6–33.0)
MCHC: 32.4 g/dL (ref 31.5–35.7)
MCV: 85 fL (ref 79–97)
Platelets: 181 10*3/uL (ref 150–450)
RBC: 4.43 x10E6/uL (ref 3.77–5.28)
RDW: 13.2 % (ref 11.7–15.4)
WBC: 4.1 10*3/uL (ref 3.4–10.8)

## 2019-09-07 LAB — BASIC METABOLIC PANEL
BUN/Creatinine Ratio: 10 (ref 9–23)
BUN: 8 mg/dL (ref 6–20)
CO2: 25 mmol/L (ref 20–29)
Calcium: 9.7 mg/dL (ref 8.7–10.2)
Chloride: 101 mmol/L (ref 96–106)
Creatinine, Ser: 0.83 mg/dL (ref 0.57–1.00)
GFR calc Af Amer: 103 mL/min/{1.73_m2} (ref >59)
GFR calc non Af Amer: 89 mL/min/{1.73_m2} (ref >59)
Glucose: 85 mg/dL (ref 65–99)
Potassium: 4.3 mmol/L (ref 3.5–5.2)
Sodium: 137 mmol/L (ref 134–144)

## 2019-09-07 NOTE — Progress Notes (Signed)
Patient denies chest pain, shortness of breath, or cardiology visit. Patient educated on visitation policy.

## 2019-09-08 ENCOUNTER — Encounter (HOSPITAL_COMMUNITY): Payer: Self-pay | Admitting: Obstetrics & Gynecology

## 2019-09-08 ENCOUNTER — Other Ambulatory Visit: Payer: Self-pay

## 2019-09-08 ENCOUNTER — Ambulatory Visit (HOSPITAL_COMMUNITY): Payer: Medicaid Other | Admitting: Certified Registered Nurse Anesthetist

## 2019-09-08 ENCOUNTER — Other Ambulatory Visit: Payer: Self-pay | Admitting: Obstetrics & Gynecology

## 2019-09-08 ENCOUNTER — Encounter (HOSPITAL_COMMUNITY)
Admission: RE | Disposition: A | Discharge status: Home / Self Care | Payer: Self-pay | Source: Home / Self Care | Attending: Obstetrics & Gynecology

## 2019-09-08 ENCOUNTER — Ambulatory Visit (HOSPITAL_COMMUNITY)
Admission: RE | Admit: 2019-09-08 | Discharge: 2019-09-08 | Disposition: A | Discharge status: Home / Self Care | Payer: Medicaid Other | Attending: Obstetrics & Gynecology | Admitting: Obstetrics & Gynecology

## 2019-09-08 DIAGNOSIS — O99211 Obesity complicating pregnancy, first trimester: Secondary | ICD-10-CM | POA: Insufficient documentation

## 2019-09-08 DIAGNOSIS — O021 Missed abortion: Secondary | ICD-10-CM | POA: Insufficient documentation

## 2019-09-08 DIAGNOSIS — Z3A12 12 weeks gestation of pregnancy: Secondary | ICD-10-CM | POA: Diagnosis not present

## 2019-09-08 DIAGNOSIS — O011 Incomplete and partial hydatidiform mole: Secondary | ICD-10-CM | POA: Diagnosis not present

## 2019-09-08 DIAGNOSIS — N1330 Unspecified hydronephrosis: Secondary | ICD-10-CM | POA: Diagnosis not present

## 2019-09-08 DIAGNOSIS — O26891 Other specified pregnancy related conditions, first trimester: Secondary | ICD-10-CM | POA: Insufficient documentation

## 2019-09-08 DIAGNOSIS — Z20822 Contact with and (suspected) exposure to covid-19: Secondary | ICD-10-CM | POA: Diagnosis not present

## 2019-09-08 HISTORY — PX: DILATION AND EVACUATION: SHX1459

## 2019-09-08 LAB — POCT I-STAT 7, (LYTES, BLD GAS, ICA,H+H)
Acid-base deficit: 1 mmol/L (ref 0.0–2.0)
Bicarbonate: 26.7 mmol/L (ref 20.0–28.0)
Calcium, Ion: 1.31 mmol/L (ref 1.15–1.40)
HCT: 34 % — ABNORMAL LOW (ref 36.0–46.0)
Hemoglobin: 11.6 g/dL — ABNORMAL LOW (ref 12.0–15.0)
O2 Saturation: 72 %
Potassium: 3.6 mmol/L (ref 3.5–5.1)
Sodium: 140 mmol/L (ref 135–145)
TCO2: 29 mmol/L (ref 22–32)
pCO2 arterial: 59.5 mmHg — ABNORMAL HIGH (ref 32.0–48.0)
pH, Arterial: 7.261 — ABNORMAL LOW (ref 7.350–7.450)
pO2, Arterial: 44 mmHg — ABNORMAL LOW (ref 83.0–108.0)

## 2019-09-08 LAB — TYPE AND SCREEN
ABO/RH(D): O POS
Antibody Screen: NEGATIVE

## 2019-09-08 LAB — ABO/RH: ABO/RH(D): O POS

## 2019-09-08 LAB — SARS CORONAVIRUS 2 BY RT PCR (HOSPITAL ORDER, PERFORMED IN ~~LOC~~ HOSPITAL LAB): SARS Coronavirus 2: NEGATIVE

## 2019-09-08 SURGERY — DILATION AND EVACUATION, UTERUS
Anesthesia: General | Site: Vagina

## 2019-09-08 MED ORDER — LIDOCAINE 2% (20 MG/ML) 5 ML SYRINGE
INTRAMUSCULAR | Status: AC
  Filled 2019-09-08: qty 5

## 2019-09-08 MED ORDER — DOXYCYCLINE HYCLATE 100 MG IV SOLR
200.0000 mg | Freq: Once | INTRAVENOUS | Status: AC
  Administered 2019-09-08: 200 mg via INTRAVENOUS
  Filled 2019-09-08: qty 200

## 2019-09-08 MED ORDER — EPHEDRINE 5 MG/ML INJ
INTRAVENOUS | Status: AC
  Filled 2019-09-08: qty 10

## 2019-09-08 MED ORDER — ACETAMINOPHEN 500 MG PO TABS
ORAL_TABLET | ORAL | Status: AC
  Administered 2019-09-08: 1000 mg via ORAL
  Filled 2019-09-08: qty 2

## 2019-09-08 MED ORDER — SCOPOLAMINE 1 MG/3DAYS TD PT72
MEDICATED_PATCH | TRANSDERMAL | Status: AC
  Administered 2019-09-08: 1.5 mg via TRANSDERMAL
  Filled 2019-09-08: qty 1

## 2019-09-08 MED ORDER — PROMETHAZINE HCL 25 MG/ML IJ SOLN: 6.2500 mg | INTRAMUSCULAR | Status: DC | PRN

## 2019-09-08 MED ORDER — MIDAZOLAM HCL 5 MG/5ML IJ SOLN
INTRAMUSCULAR | Status: DC | PRN
  Administered 2019-09-08: 2 mg via INTRAVENOUS

## 2019-09-08 MED ORDER — LACTATED RINGERS IV SOLN: INTRAVENOUS | Status: DC

## 2019-09-08 MED ORDER — CHLORHEXIDINE GLUCONATE 0.12 % MT SOLN
OROMUCOSAL | Status: AC
  Administered 2019-09-08: 15 mL via OROMUCOSAL
  Filled 2019-09-08: qty 15

## 2019-09-08 MED ORDER — CHLORHEXIDINE GLUCONATE 0.12 % MT SOLN: 15.0000 mL | Freq: Once | OROMUCOSAL | Status: AC

## 2019-09-08 MED ORDER — OXYCODONE HCL 5 MG PO TABS: 5.0000 mg | ORAL_TABLET | Freq: Once | ORAL | Status: DC | PRN

## 2019-09-08 MED ORDER — LIDOCAINE 2% (20 MG/ML) 5 ML SYRINGE
INTRAMUSCULAR | Status: DC | PRN
  Administered 2019-09-08: 30 mg via INTRAVENOUS

## 2019-09-08 MED ORDER — PHENYLEPHRINE 40 MCG/ML (10ML) SYRINGE FOR IV PUSH (FOR BLOOD PRESSURE SUPPORT)
PREFILLED_SYRINGE | INTRAVENOUS | Status: AC
  Filled 2019-09-08: qty 40

## 2019-09-08 MED ORDER — BUPIVACAINE HCL (PF) 0.5 % IJ SOLN
INTRAMUSCULAR | Status: DC | PRN
  Administered 2019-09-08: 10 mL

## 2019-09-08 MED ORDER — ALBUMIN HUMAN 5 % IV SOLN: INTRAVENOUS | Status: DC | PRN

## 2019-09-08 MED ORDER — FENTANYL CITRATE (PF) 250 MCG/5ML IJ SOLN
INTRAMUSCULAR | Status: AC
  Filled 2019-09-08: qty 5

## 2019-09-08 MED ORDER — DEXAMETHASONE SODIUM PHOSPHATE 4 MG/ML IJ SOLN
INTRAMUSCULAR | Status: DC | PRN
  Administered 2019-09-08: 4 mg via INTRAVENOUS

## 2019-09-08 MED ORDER — SCOPOLAMINE 1 MG/3DAYS TD PT72: 1.0000 | MEDICATED_PATCH | TRANSDERMAL | Status: DC

## 2019-09-08 MED ORDER — GLYCOPYRROLATE PF 0.2 MG/ML IJ SOSY
PREFILLED_SYRINGE | INTRAMUSCULAR | Status: AC
  Filled 2019-09-08: qty 1

## 2019-09-08 MED ORDER — POVIDONE-IODINE 10 % EX SWAB: 2.0000 "application " | Freq: Once | CUTANEOUS | Status: DC

## 2019-09-08 MED ORDER — FERROUS SULFATE 325 (65 FE) MG PO TABS: 325.0000 mg | ORAL_TABLET | Freq: Every day | ORAL | 3 refills | Status: DC

## 2019-09-08 MED ORDER — OXYTOCIN-SODIUM CHLORIDE 30-0.9 UT/500ML-% IV SOLN
INTRAVENOUS | Status: DC | PRN
Start: 2019-09-08 — End: 2019-09-08
  Administered 2019-09-08: 20 [IU] via INTRAVENOUS

## 2019-09-08 MED ORDER — PHENYLEPHRINE HCL (PRESSORS) 10 MG/ML IV SOLN
INTRAVENOUS | Status: DC | PRN
Start: 2019-09-08 — End: 2019-09-08
  Administered 2019-09-08 (×2): 100 ug via INTRAVENOUS
  Administered 2019-09-08 (×2): 80 ug via INTRAVENOUS
  Administered 2019-09-08: 10 ug via INTRAVENOUS
  Administered 2019-09-08: 100 ug via INTRAVENOUS
  Administered 2019-09-08: 80 ug via INTRAVENOUS
  Administered 2019-09-08: 100 ug via INTRAVENOUS

## 2019-09-08 MED ORDER — DEXAMETHASONE SODIUM PHOSPHATE 10 MG/ML IJ SOLN
INTRAMUSCULAR | Status: AC
  Filled 2019-09-08: qty 1

## 2019-09-08 MED ORDER — BUPIVACAINE HCL (PF) 0.5 % IJ SOLN
INTRAMUSCULAR | Status: AC
  Filled 2019-09-08: qty 30

## 2019-09-08 MED ORDER — OXYCODONE-ACETAMINOPHEN 5-325 MG PO TABS: 1.0000 | ORAL_TABLET | Freq: Four times a day (QID) | ORAL | 0 refills | Status: DC | PRN

## 2019-09-08 MED ORDER — HYDROMORPHONE HCL 1 MG/ML IJ SOLN: 0.2500 mg | INTRAMUSCULAR | Status: DC | PRN

## 2019-09-08 MED ORDER — OXYTOCIN 10 UNIT/ML IJ SOLN
INTRAMUSCULAR | Status: AC
  Filled 2019-09-08: qty 1

## 2019-09-08 MED ORDER — MEPERIDINE HCL 25 MG/ML IJ SOLN: 6.2500 mg | INTRAMUSCULAR | Status: DC | PRN

## 2019-09-08 MED ORDER — ACETAMINOPHEN 500 MG PO TABS: 1000.0000 mg | ORAL_TABLET | Freq: Once | ORAL | Status: AC

## 2019-09-08 MED ORDER — DOXYCYCLINE HYCLATE 100 MG IV SOLR: 200.0000 mg | INTRAVENOUS | Status: DC

## 2019-09-08 MED ORDER — FENTANYL CITRATE (PF) 100 MCG/2ML IJ SOLN
INTRAMUSCULAR | Status: DC | PRN
  Administered 2019-09-08: 50 ug via INTRAVENOUS

## 2019-09-08 MED ORDER — MIDAZOLAM HCL 2 MG/2ML IJ SOLN
INTRAMUSCULAR | Status: AC
  Filled 2019-09-08: qty 2

## 2019-09-08 MED ORDER — GLYCOPYRROLATE 0.2 MG/ML IJ SOLN
INTRAMUSCULAR | Status: DC | PRN
  Administered 2019-09-08: .2 mg via INTRAVENOUS

## 2019-09-08 MED ORDER — ONDANSETRON HCL 4 MG/2ML IJ SOLN
INTRAMUSCULAR | Status: DC | PRN
  Administered 2019-09-08: 4 mg via INTRAVENOUS

## 2019-09-08 MED ORDER — ONDANSETRON HCL 4 MG/2ML IJ SOLN
INTRAMUSCULAR | Status: AC
  Filled 2019-09-08: qty 2

## 2019-09-08 MED ORDER — PROPOFOL 10 MG/ML IV BOLUS
INTRAVENOUS | Status: DC | PRN
  Administered 2019-09-08: 200 mg via INTRAVENOUS

## 2019-09-08 MED ORDER — ORAL CARE MOUTH RINSE: 15.0000 mL | Freq: Once | OROMUCOSAL | Status: AC

## 2019-09-08 MED ORDER — OXYCODONE HCL 5 MG/5ML PO SOLN: 5.0000 mg | Freq: Once | ORAL | Status: DC | PRN

## 2019-09-08 SURGICAL SUPPLY — 21 items
CATH ROBINSON RED A/P 16FR (CATHETERS) ×2 IMPLANT
DECANTER SPIKE VIAL GLASS SM (MISCELLANEOUS) IMPLANT
FILTER UTR ASPR ASSEMBLY (MISCELLANEOUS) ×2 IMPLANT
GLOVE BIO SURGEON STRL SZ 6.5 (GLOVE) ×2 IMPLANT
GLOVE BIOGEL PI IND STRL 7.0 (GLOVE) ×2 IMPLANT
GLOVE BIOGEL PI INDICATOR 7.0 (GLOVE) ×2
GOWN STRL REUS W/ TWL LRG LVL3 (GOWN DISPOSABLE) ×2 IMPLANT
GOWN STRL REUS W/TWL LRG LVL3 (GOWN DISPOSABLE) ×2
HOSE CONNECTING 18IN BERKELEY (TUBING) ×2 IMPLANT
KIT BERKELEY 1ST TRI 3/8 NO TR (MISCELLANEOUS) ×2 IMPLANT
KIT BERKELEY 1ST TRIMESTER 3/8 (MISCELLANEOUS) ×2 IMPLANT
NS IRRIG 1000ML POUR BTL (IV SOLUTION) ×2 IMPLANT
PACK VAGINAL MINOR WOMEN LF (CUSTOM PROCEDURE TRAY) ×2 IMPLANT
PAD OB MATERNITY 4.3X12.25 (PERSONAL CARE ITEMS) ×2 IMPLANT
SET BERKELEY SUCTION TUBING (SUCTIONS) ×2 IMPLANT
TOWEL GREEN STERILE FF (TOWEL DISPOSABLE) ×4 IMPLANT
UNDERPAD 30X36 HEAVY ABSORB (UNDERPADS AND DIAPERS) ×2 IMPLANT
VACURETTE 10 RIGID CVD (CANNULA) ×2 IMPLANT
VACURETTE 7MM CVD STRL WRAP (CANNULA) IMPLANT
VACURETTE 8 RIGID CVD (CANNULA) IMPLANT
VACURETTE 9 RIGID CVD (CANNULA) ×2 IMPLANT

## 2019-09-08 NOTE — Transfer of Care (Signed)
Immediate Anesthesia Transfer of Care Note  Patient: Jackie Nelson  Procedure(s) Performed: DILATATION AND EVACUATION (N/A Vagina )  Patient Location: PACU  Anesthesia Type:General  Level of Consciousness: awake and drowsy  Airway & Oxygen Therapy: Patient Spontanous Breathing and Patient connected to face mask oxygen  Post-op Assessment: Report given to RN  Post vital signs: Reviewed and stable  Last Vitals:  Vitals Value Taken Time  BP 127/61 09/08/19 1730  Temp    Pulse 96 09/08/19 1733  Resp 15 09/08/19 1733  SpO2 100 % 09/08/19 1733  Vitals shown include unvalidated device data.  Last Pain:  Vitals:   09/08/19 1328  TempSrc:   PainSc: 0-No pain         Complications: No complications documented.

## 2019-09-08 NOTE — Anesthesia Postprocedure Evaluation (Signed)
Anesthesia Post Note  Patient: Jackie Nelson  Procedure(s) Performed: DILATATION AND EVACUATION (N/A Vagina )     Patient location during evaluation: PACU Anesthesia Type: General Level of consciousness: awake and alert, oriented and patient cooperative Pain management: pain level controlled Vital Signs Assessment: post-procedure vital signs reviewed and stable Respiratory status: spontaneous breathing, nonlabored ventilation and respiratory function stable Cardiovascular status: blood pressure returned to baseline and stable Postop Assessment: no apparent nausea or vomiting Anesthetic complications: no   No complications documented.  Last Vitals:  Vitals:   09/08/19 1306 09/08/19 1730  BP: (!) 138/41 127/61  Pulse: 74 98  Resp: 20 14  Temp: 36.5 C (!) 36.3 C  SpO2: 100% 98%    Last Pain:  Vitals:   09/08/19 1730  TempSrc:   PainSc: Asleep                 Janiesha Diehl,E. Deroy Noah

## 2019-09-08 NOTE — Anesthesia Procedure Notes (Addendum)
Procedure Name: LMA Insertion Date/Time: 09/08/2019 4:46 PM Performed by: Gwenyth Allegra, CRNA Pre-anesthesia Checklist: Patient identified, Emergency Drugs available, Timeout performed and Suction available Patient Re-evaluated:Patient Re-evaluated prior to induction Oxygen Delivery Method: Circle system utilized Preoxygenation: Pre-oxygenation with 100% oxygen Induction Type: IV induction Ventilation: Mask ventilation without difficulty LMA: LMA inserted LMA Size: 4.0

## 2019-09-08 NOTE — Anesthesia Preprocedure Evaluation (Addendum)
Anesthesia Evaluation  Patient identified by MRN, date of birth, ID band Patient awake    Reviewed: Allergy & Precautions, NPO status , Patient's Chart, lab work & pertinent test results  History of Anesthesia Complications Negative for: history of anesthetic complications  Airway Mallampati: II  TM Distance: >3 FB Neck ROM: Full    Dental  (+) Dental Advisory Given   Pulmonary neg pulmonary ROS,  09/08/2019 SARS coronavirus NEG   breath sounds clear to auscultation       Cardiovascular negative cardio ROS   Rhythm:Regular Rate:Normal     Neuro/Psych negative neurological ROS  negative psych ROS   GI/Hepatic negative GI ROS, Neg liver ROS,   Endo/Other  Obesity BMI 32   Renal/GU negative Renal ROS  negative genitourinary   Musculoskeletal negative musculoskeletal ROS (+)   Abdominal (+) + obese,   Peds  Hematology negative hematology ROS (+)   Anesthesia Other Findings   Reproductive/Obstetrics Missed Ab                            Anesthesia Physical Anesthesia Plan  ASA: II  Anesthesia Plan: General   Post-op Pain Management:    Induction: Intravenous  PONV Risk Score and Plan: 4 or greater and Ondansetron, Midazolam, Scopolamine patch - Pre-op and Dexamethasone  Airway Management Planned: LMA  Additional Equipment: None  Intra-op Plan:   Post-operative Plan: Extubation in OR  Informed Consent: I have reviewed the patients History and Physical, chart, labs and discussed the procedure including the risks, benefits and alternatives for the proposed anesthesia with the patient or authorized representative who has indicated his/her understanding and acceptance.     Dental advisory given and Interpreter used for interveiw  Plan Discussed with: CRNA and Surgeon  Anesthesia Plan Comments: (Pt consented by Dr. Salvadore Farber)       Anesthesia Quick Evaluation

## 2019-09-08 NOTE — Anesthesia Procedure Notes (Signed)
Performed by: Adami, Richard, CRNA       

## 2019-09-08 NOTE — Interval H&P Note (Signed)
History and Physical Interval Note:  09/08/2019 3:19 PM Blood pressure (!) 138/41, pulse 74, temperature 97.7 F (36.5 C), temperature source Temporal, resp. rate 20, height 5\' 4"  (1.626 m), weight 83.9 kg, SpO2 100 %. Resp: nl effort Genl NAD   Jackie Nelson  has presented today for surgery, with the diagnosis of Missed AB Probable Molar Pregnancy.  The various methods of treatment have been discussed with the patient and family. After consideration of risks, benefits and other options for treatment, the patient has consented to  Procedure(s): DILATATION AND EVACUATION (N/A) as a surgical intervention.  The patient's history has been reviewed, patient examined, no change in status, stable for surgery.  I have reviewed the patient's chart and labs.  Questions were answered to the patient's satisfaction.     [name]

## 2019-09-08 NOTE — Op Note (Signed)
Shirelle Manship PROCEDURE DATE: 09/08/2019  PREOPERATIVE DIAGNOSIS: 12 week missed abortion POSTOPERATIVE DIAGNOSIS: The same PROCEDURE:     Dilation and Evacuation SURGEON:  Scheryl Darter MD  INDICATIONS: 39 y.o. [id number] with MAB at [redacted] weeks gestation, needing surgical completion.  Risks of surgery were discussed with the patient including but not limited to: bleeding which may require transfusion; infection which may require antibiotics; injury to uterus or surrounding organs; need for additional procedures including laparotomy or laparoscopy; possibility of intrauterine scarring which may impair future fertility; and other postoperative/anesthesia complications. Written informed consent was obtained.    FINDINGS:  A 9 week size uterus, moderate amounts of products of conception, specimen sent to pathology.  ANESTHESIA:    Monitored intravenous sedation, paracervical block. INTRAVENOUS FLUIDS:  2000 ml of LR ESTIMATED BLOOD LOSS:  1800 ml. SPECIMENS:  Products of conception sent to pathology COMPLICATIONS:  Excessive blood loss.  PROCEDURE DETAILS:  The patient received intravenous Doxycycline while in the preoperative area.  She was then taken to the operating room where monitored intravenous sedation was administered and was found to be adequate.  After an adequate timeout was performed, she was placed in the dorsal lithotomy position and examined; then prepped and draped in the sterile manner.   Her bladder was catheterized for an unmeasured amount of clear, yellow urine. A vaginal speculum was then placed in the patient's vagina and a single tooth tenaculum was applied to the anterior lip of the cervix.  A paracervical block using 10 ml of 0.5% Marcaine was administered. The cervix was gently dilated to accommodate a 10 mm suction curette that was gently advanced to the uterine fundus.  The suction device was then activated and curette slowly rotated to clear the uterus of products of conception.  During the procedure there was a period of excessive blood loss that was managed with IV Pitocin and resolved once all tissue was evacuated. Stat Hb was 11.6.There was minimal bleeding noted and the tenaculum removed with good hemostasis noted.   All instruments were removed from the patient's vagina.  Sponge and instrument counts were correct times two  The patient tolerated the procedure well and was taken to the recovery area awake, and in stable condition.   Adam Phenix, MD 09/08/2019 5:38 PM

## 2019-09-09 ENCOUNTER — Encounter (HOSPITAL_COMMUNITY): Payer: Self-pay | Admitting: Obstetrics & Gynecology

## 2019-09-09 ENCOUNTER — Other Ambulatory Visit (HOSPITAL_COMMUNITY): Payer: Self-pay | Admitting: Urology

## 2019-09-09 DIAGNOSIS — O020 Blighted ovum and nonhydatidiform mole: Secondary | ICD-10-CM

## 2019-09-09 DIAGNOSIS — R109 Unspecified abdominal pain: Secondary | ICD-10-CM

## 2019-09-09 DIAGNOSIS — N261 Atrophy of kidney (terminal): Secondary | ICD-10-CM

## 2019-09-09 DIAGNOSIS — N1330 Unspecified hydronephrosis: Secondary | ICD-10-CM

## 2019-09-09 HISTORY — DX: Blighted ovum and nonhydatidiform mole: O02.0

## 2019-09-10 LAB — SURGICAL PATHOLOGY

## 2019-09-11 NOTE — Progress Notes (Signed)
Pathology showed partial molar pregnancy. She should return in 2 weeks for postop and beta HCG

## 2019-09-16 ENCOUNTER — Telehealth: Payer: Self-pay | Admitting: *Deleted

## 2019-09-16 NOTE — Telephone Encounter (Signed)
-----   Message from Katrina Stack, RN sent at 09/16/2019  9:50 AM EDT -----  ----- Message ----- From: Adam Phenix, MD Sent: 09/11/2019   2:27 PM EDT To: [facility] Clinical Pool  Pathology showed partial molar pregnancy. She should return in 2 weeks for postop and beta HCG

## 2019-09-16 NOTE — Telephone Encounter (Signed)
I called Jackie Nelson with [employer] # [phone] to her mobile number and heard message this number does not accept incoming calls. We called her home number and and left a message we are calling with information from your provider and please call our office.  Will leave in in box so we can try again to call her. Rahman Ferrall,RN

## 2019-09-21 NOTE — Telephone Encounter (Signed)
Called patient with assistance of [facility] [employer] # [phone].   Called patients home phone # and LM for her to call the office.   Tried to call the mobile phone # and message was received that the phone does not accept incoming calls.   Will create letter to mail out today.   Can get patient in for HCG this week 7/16 at 10 am, however provider appt will be 7/29 @ 2:35 pm.

## 2019-09-22 ENCOUNTER — Other Ambulatory Visit: Payer: Medicaid Other

## 2019-09-24 ENCOUNTER — Other Ambulatory Visit: Payer: Medicaid Other

## 2019-09-28 ENCOUNTER — Ambulatory Visit (HOSPITAL_COMMUNITY)
Admission: RE | Admit: 2019-09-28 | Discharge: 2019-09-28 | Disposition: A | Discharge status: Home / Self Care | Payer: Medicaid Other | Source: Ambulatory Visit | Attending: Urology | Admitting: Urology

## 2019-09-28 ENCOUNTER — Encounter (HOSPITAL_COMMUNITY): Payer: Self-pay

## 2019-09-28 ENCOUNTER — Other Ambulatory Visit: Payer: Self-pay

## 2019-09-28 DIAGNOSIS — N1330 Unspecified hydronephrosis: Secondary | ICD-10-CM | POA: Diagnosis not present

## 2019-09-28 DIAGNOSIS — N261 Atrophy of kidney (terminal): Secondary | ICD-10-CM

## 2019-09-28 DIAGNOSIS — R109 Unspecified abdominal pain: Secondary | ICD-10-CM

## 2019-09-28 MED ORDER — FUROSEMIDE 10 MG/ML IJ SOLN
INTRAMUSCULAR | Status: AC
  Filled 2019-09-28: qty 8

## 2019-09-28 MED ORDER — TECHNETIUM TC 99M MERTIATIDE: 5.4000 | Freq: Once | INTRAVENOUS | Status: DC

## 2019-09-28 MED ORDER — FUROSEMIDE 10 MG/ML IJ SOLN: 42.0000 mg | Freq: Once | INTRAMUSCULAR | Status: DC

## 2019-10-07 ENCOUNTER — Other Ambulatory Visit: Payer: Self-pay

## 2019-10-07 ENCOUNTER — Encounter: Payer: Self-pay | Admitting: Obstetrics & Gynecology

## 2019-10-07 ENCOUNTER — Ambulatory Visit (INDEPENDENT_AMBULATORY_CARE_PROVIDER_SITE_OTHER): Payer: Medicaid Other | Admitting: Obstetrics & Gynecology

## 2019-10-07 VITALS — BP 134/71 | HR 72 | Wt 179.0 lb

## 2019-10-07 DIAGNOSIS — O020 Blighted ovum and nonhydatidiform mole: Secondary | ICD-10-CM | POA: Diagnosis not present

## 2019-10-07 DIAGNOSIS — Z30013 Encounter for initial prescription of injectable contraceptive: Secondary | ICD-10-CM | POA: Diagnosis not present

## 2019-10-07 DIAGNOSIS — O0889 Other complications following an ectopic and molar pregnancy: Secondary | ICD-10-CM

## 2019-10-07 LAB — CBC
Hematocrit: 34 % (ref 34.0–46.6)
Hemoglobin: 10.5 g/dL — ABNORMAL LOW (ref 11.1–15.9)
MCH: 26.9 pg (ref 26.6–33.0)
MCHC: 30.9 g/dL — ABNORMAL LOW (ref 31.5–35.7)
MCV: 87 fL (ref 79–97)
Platelets: 196 10*3/uL (ref 150–450)
RBC: 3.91 x10E6/uL (ref 3.77–5.28)
RDW: 13.9 % (ref 11.7–15.4)
WBC: 4.6 10*3/uL (ref 3.4–10.8)

## 2019-10-07 MED ORDER — MEDROXYPROGESTERONE ACETATE 150 MG/ML IM SUSP
150.0000 mg | Freq: Once | INTRAMUSCULAR | Status: AC
  Administered 2019-10-07: 150 mg via INTRAMUSCULAR

## 2019-10-07 MED ORDER — MEDROXYPROGESTERONE ACETATE 150 MG/ML IM SUSP: 150.0000 mg | INTRAMUSCULAR | 0 refills | Status: AC

## 2019-10-07 NOTE — Patient Instructions (Addendum)
[facility] [employer]  Department of Social [place] [employer] [address] [place], [place] [address] [phone]   or  www.https://hall.info/ **SNAP/EBT/ Other nutritional benefits  [facility] [address] [place], [place], [place] [address] [phone]  or  https://king.net/ **WIC for  women who are pregnant and postpartum, infants and children up to 40 years old  [facility] [address], [place], [place] [address] [phone]   or   www.theblessedtable.org  **Food pantry  [facility] [address] [place], [place], [place] [address] [phone]   or   https://brotherkolbes.godaddysites.com  **Emergency food and prepared meals  [address] [place] [facility] [address], [place], [place] [address] [phone]   or   www.cedargrovetop.us **Food pantry  [facility] [address] [place], [place] [address] [phone]   or   www.GolfingFamily.no **Food pantry  [facility] [address], [place], [place] [address] [phone] **Food pantry  [facility] [address], [place], [place] [address] [phone]   or   www.greensborourbanministry.org  Tour manager and prepared meals  [facility] [address] [place], [address], [place], [place] [address] VerifiedMovies.gl  **Food pantry  [employer] [facility] [address] [place], [place] [address] [phone]   or   www.lbcnow.org  **Food pantry  [facility] [address], [place], [place] [address] [phone]   or   WorkingMBA.co.nz **Food pantry, nutrition education, gardening activities  [facility] [address], [place], [place] [address] [phone] **Food pantry  [facility]-  [address] [place], [place] [address] [phone]   or   www.salvationarmyofgreensboro.[place]oddie Mc of [place] [address] [place], [place] [address] [phone]   or   http://senior-resources-[place].org [employer] Food on Wheels Program  [facility] [address], [place], [place] [address] [phone]   or   www.stmattchurch.com  **Food pantry  [facility] [address] [place], [place] [address] [phone]   or   vandaliapresbyterianchurch.org **Food pantry    Molar Pregnancy  A molar pregnancy (hydatidiform mole) is a mass of tissue that grows in the uterus after an egg is fertilized incorrectly. The mass does not develop into a fetus, and is considered an abnormal pregnancy. Usually, the pregnancy ends on its own through miscarriage. In some cases, treatment may be required. What are the causes? This condition is caused by an egg that is fertilized incorrectly so that it has abnormal genetic material (chromosomes). This can result in one of two types of molar pregnancies:  Complete molar pregnancy. This is when all of the chromosomes in the fertilized egg are from the father, and none are from the mother.  Partial molar pregnancy. This is when the fertilized egg has chromosomes from the father and mother, but it has too many chromosomes. What increases the risk? This condition is more likely to develop in:  Women who are over the age of 70 or under the age of 62.  Women who have had a molar pregnancy in the past (very rare). Other possible risk factors include:  Smoking more than 15 cigarettes a day.  History of infertility.  Having a blood type A, B, or AB.  Having a lack (deficiency) of vitamin A.  Using birth control pills (oral contraceptives). What are the signs or  symptoms? Symptoms of this condition include:  Vaginal bleeding.  Missed menstrual period.  The uterus growing faster than expected for a normal pregnancy.  Severe nausea and vomiting.  Severe pressure or pain in the uterus.  Abnormal ovarian cysts (theca lutein cysts).  Vaginal discharge that looks like grapes.  High blood pressure (early onset of preeclampsia).  Overactive thyroid gland (hyperthyroidism).  Not having enough red blood cells or hemoglobin (anemia). How is this diagnosed? This condition is diagnosed based on ultrasound and blood tests. How is this treated? Usually, molar pregnancies end on their own by miscarriage. A health care provider may manage this condition by:  Monitoring the levels of pregnancy hormones in your blood to make sure that the hormone levels are decreasing as expected.  Giving you a medicine called Rho (D) immune globulin. This medicine helps to prevent problems that may occur in future pregnancies as a result of a protein on red blood cells (Rh factor). You may be given this medicine if you do not have an Rh factor (you are Rh negative) and your sex partner has an Rh factor (he is Rh positive).  Putting you on chemotherapy. This involves taking medicines that regulate levels of pregnancy hormones. This may be done if your pregnancy hormone levels are not decreasing as expected.  Performing a procedure called dilation and curettage (D&C), or vacuum curettage. These are minor procedures that involve scraping or suctioning the molar pregnancy out of the uterus and removing it through the vagina. Even if a molar pregnancy ends on its own, one of these procedures may be done to make sure that all the abnormal tissue is out of the uterus.  Doing a surgical removal of the uterus (hysterectomy). Follow these instructions at home:  Avoid getting pregnant for 6-12 months, or as long as told by your health care provider. To avoid getting pregnant, avoid  having sex or use a reliable form of birth control every time you have sex.  Take over-the-counter and prescription medicines only as told by your health care provider.  Rest as needed, and slowly return to your normal activities.  Think about joining a support group. If you are struggling with grief, ask your health care provider for help.  Keep all follow-up visits as told by your health care provider. This is important. You may need follow-up blood tests or ultrasounds. Contact a health care provider if:  You continue to have irregular vaginal bleeding.  You have abdominal pain. Summary  A molar pregnancy (hydatidiform mole) is a mass of tissue that grows in the uterus after an egg is fertilized incorrectly.  This condition is more likely to develop in women who are over the age of 80 or under the age of 34 or women who have had a molar pregnancy in the past.  The most common symptom of this condition is vaginal bleeding.  Usually, molar pregnancy ends with a miscarriage, and no treatment is needed. This information is not intended to replace advice given to you by your health care provider. Make sure you discuss any questions you have with your health care provider. Document Revised: 02/07/2017 Document Reviewed: 05/01/2016 Elsevier Patient Education  2020 [employer].

## 2019-10-07 NOTE — Progress Notes (Signed)
Subjective:     Inioluwa Boulay is a 39 y.o. female who presents to the clinic 4 weeks status post D&E for partial molar pregnancy . Eating a regular diet without difficulty. Bowel movements are normal. The patient is not having any pain.  The following portions of the patient's history were reviewed and updated as appropriate: allergies, current medications, past family history, past medical history, past social history, past surgical history and problem list.  Review of Systems Pertinent items are noted in HPI.    Objective:    BP (!) 134/71   Pulse 72   Wt 179 lb (81.2 kg)   Breastfeeding No   BMI 30.73 kg/m  General:  alert, cooperative and no distress  Abdomen: non-tender        Assessment:    Doing well postoperatively. Operative findings again reviewed. Pathology report discussed.    Plan:    1. Continue any current medications. 2. Wound care discussed. 3. Activity restrictions: none 4. Anticipated return to work: now. 5. Follow up: 1 week for HCG. Discussed avoiding pregnancy, nature of her diagnosis, and she requests DMPA today  l. Patient ID: Aryonna Gunnerson, female   DOB: Jul 16, 1980, 39 y.o.   MRN: [id number]

## 2019-10-08 DIAGNOSIS — O0889 Other complications following an ectopic and molar pregnancy: Secondary | ICD-10-CM | POA: Insufficient documentation

## 2019-10-08 LAB — BETA HCG QUANT (REF LAB): hCG Quant: 232 m[IU]/mL

## 2019-10-12 ENCOUNTER — Telehealth: Payer: Self-pay | Admitting: General Practice

## 2019-10-12 NOTE — Telephone Encounter (Signed)
Called patient with pacific interpreter [phone], no answer- left message stating we are trying to reach her with results, please call us back. Attempted to call patient at mobile number but no answer & unable to leave voicemail message. Per chart review, patient has appt 8/5 for lab work. Note left to review results at appt.

## 2019-10-12 NOTE — Telephone Encounter (Signed)
-----   Message from Adam Phenix, MD sent at 10/08/2019 12:05 PM EDT ----- F/u 1 week for partial molar pregnancy

## 2019-10-14 ENCOUNTER — Other Ambulatory Visit: Payer: Self-pay

## 2019-10-14 ENCOUNTER — Ambulatory Visit (INDEPENDENT_AMBULATORY_CARE_PROVIDER_SITE_OTHER): Payer: Medicaid Other | Admitting: Lactation Services

## 2019-10-14 ENCOUNTER — Other Ambulatory Visit: Payer: Medicaid Other

## 2019-10-14 ENCOUNTER — Other Ambulatory Visit: Payer: Self-pay | Admitting: General Practice

## 2019-10-14 VITALS — BP 126/50 | HR 71 | Wt 178.7 lb

## 2019-10-14 DIAGNOSIS — O0889 Other complications following an ectopic and molar pregnancy: Secondary | ICD-10-CM

## 2019-10-14 DIAGNOSIS — Z30013 Encounter for initial prescription of injectable contraceptive: Secondary | ICD-10-CM | POA: Diagnosis not present

## 2019-10-14 MED ORDER — MEDROXYPROGESTERONE ACETATE 150 MG/ML IM SUSP
150.0000 mg | Freq: Once | INTRAMUSCULAR | Status: AC
  Administered 2019-10-14: 150 mg via INTRAMUSCULAR

## 2019-10-14 NOTE — Addendum Note (Signed)
Addended by: Ed Blalock on: 10/14/2019 04:14 PM   Modules accepted: Orders

## 2019-10-14 NOTE — Patient Instructions (Signed)
[place] [employer] [facility]  [place] [address], [place], [place] [address] [phone]   or  www.[place].gov/[place]/human-services/social-services **SNAP/EBT/ Other nutritional benefits  [place] DHHS-Public Health-WIC [address], [place], [place] [address] [phone]) [phone]  or  https://[place]nc.gov/[place]-county/human-services/health-department **WIC for  women who are pregnant and postpartum, infants and children up to 5 years old  [facility] [address], [place], [place] [address] [phone]) [phone]   or   www.theblessedtable.org  **Food pantry  [facility] [address], [place], [place] [address] [phone]   or   https://brotherkolbes.godaddysites.com  **Emergency food and prepared meals  [place] [facility] [address], [place], [place] [address] [phone]) [phone]   or   www.cedargrovetop.us **Food pantry  [facility] [address], [place], [place] [address] [phone]   or   www.facebook.com/[facility]-116430931718202 **Food pantry  [facility] [address], [place], [place] [address] [phone] **Food pantry  [facility] [facility] [address], [place], [place] [address] [phone]) [phone]   or   www.greensborourbanministry.org  **Food pantry and prepared meals  [facility] [address], [place], [place] [address] https://jfsgreensboro.org/  **Food pantry  [place] [facility] [address], [place], [place] [address] [phone]) [phone]   or   www.lbcnow.org  **Food pantry  [facility] [address], [place], [place] [address] [phone]) [phone]   or   http://www.onestepfurther.com **Food pantry, nutrition education, gardening activities  [facility] [address], [place], [place] [address] [phone] **Food pantry  [facility] [place] [address], [place], [place] [address] [phone]) [phone]   or   www.salvationarmyofgreensboro.org **Food pantry  [facility] [place] [address], [place], [place] [address] [phone]) [phone]   or   http://senior-resources-[place].org **Meals on Wheels Program  [facility] [address], [place], [place] [address] [phone]) [phone]   or   www.stmattchurch.com  **Food pantry  [place] [facility] [address], [place], [place] [address] [phone]   or   vandaliapresbyterianchurch.org **Food pantry  [place] [facility]  [place] [facility] [address], [place], [place] [address] [phone]) [phone]   or   www.facebook.com/JulianUMC Food pantry  [place] [facility] [address], [place], [place] [address] [phone]) [phone] **Food pantry  [place] [facility]   Department of [place] Health[place] [address], [place], [place] [address] [phone]) [phone]   or   www.co.[place].us/ph/  [place] DHHS-Public Health-WIC ([place]) [address], [place], [place] [address] [phone]) [phone]   or   https://www.[place]nc.gov/our-county/human-services/health-department **WIC for pregnant and postpartum women, infants and children up to 5 years old  [facility] [address], [place], [place] [address] [phone]) [phone] **Food pantry  [facility] [address], [place], [place] [address] [phone]   or   emerywoodbaptistchurch.com *Food pantry  [employer] [facility] [address], [place], [place] [address] [phone]) [phone]   or   www.fcch[place].org **Food pantry  [facility] [address], [place], [place] [address] [phone]) [phone]   or    www.helpinghandshp.org **Food pantry  [facility] [address], [place],  [address] [phone]) [phone]   or   www.facebook.com/KBCI1 **Food Pantry  [facility] [address], [place], [place] [address] [phone]) [phone]   or   www.abbottscreek.org **Food pantry  [facility] [place] [phone])[phone]   **Delivers meals  [facility] [address], [place], [place] [address] [phone]) [phone]   or   nbfgm.sundaystreamwebsites.com  **Food pantry  [facility] [place] [address], [place], [place] [address] [phone]) [phone]   or   www.odm-hp.org  **Food pantry  [facility] [address], [place], [place] [address] [phone]) [phone]   or   R2live.tv **Food pantry  [facility] [address], [place], [place] [address] [phone]) [phone]   or   www.salvationarmycarolinas.org/[place] **Emergency food and pet food  [facility] [address], [place], [place] [address] [phone]   or   www.senioradults.org **Congregate and delivered meals to older adults  [employer] [facility] [place] [address], [place], [place] [address] [phone]) [phone]   or   www.unitedwayhp.org **Back Pack Program for elementary school students  [address] [facility] [address], [place], [place] [address] [phone]) [phone]   or   www.wardstreetcommunityresources.org **Food pantry  [facility] [address], [place], [place] [address] [phone]) [phone]   or   www.ywcahp.com **Emergency food, nutrition classes, food budgeting  [facility] [place]  Department [place]  [address], [place], [place] [address] [phone]) [phone]  or   www.co.[place].H[place]op.us/pview.aspx?id=14850&catid=407 **SNAP/Other nutrition benefits  [place] Department of Health and Human Services [address], [place], [place] [address] [phone]   or  https://www.rockinghamcountydhhs.org/  **SNAP/Other nutrition benefits  [place] Department of Public Health-WIC & Nutrition Services [address], [place], [place] [address] [phone]) [phone]  or  http://www.rockinghamcountypublichealth.org **WIC for pregnant and postpartum women, infants and children up to 5 years old  Aging, Disability and Transit Services[place] [address], [place], [place] [address] [phone]) [phone]  or www.adtsrc.org **Prepared meals for older adults  [facility] [address], [place], [place] [address] [phone]) [phone]  or  www.calvary4you.com **Food pantry  [facility] [address], [place], [place] [address] [phone]) [phone]   or   https://www.handsofgod.org/  **Food pantry  [facility] [address], [place], [place] [address] [phone]) [phone] **Food pantry  [facility] [address], [place], [place] [address] [phone]) [phone]   or   www.ci.[place].us/government/parks_and_recreation/senior_center/index.php **Congregate meal for older adults  [facility] [address], [place], [place] [address] [phone]) [phone]   or www.reidsvilleoutreachcenter.org  **Food pantry  [facility] [address], [place], [place] [address] [phone]) [phone]   or   www.salvationarmycarolinas.org/commands/[place] **Food pantry    

## 2019-10-14 NOTE — Progress Notes (Signed)
Spoke with patient with assistance of [employer] [phone].   Jackie Nelson here for Depo-Provera  Injection.  Injection administered without complication. Patient will return in 3 months for next injection. Patient tolerated well.   Patient reports she is still having some light bleeding, she is changing her pads 3-4 times a day. She is concerned she is still bleeding and it has been about 6 weeks since molar pregnancy. Reviewed this can be normal for some and to let us know if it continues over the next few weeks.   Patient aware she will be called in the next 24 hours with the results of her blood work. Patient asked to be called after 4. She repots she cannot answer the phone during work hours and OK to leave a message on her phone [phone] with results and with follow up appointment.   Ed Blalock, RN 10/14/2019  1:42 PM

## 2019-10-15 ENCOUNTER — Telehealth: Payer: Self-pay | Admitting: Lactation Services

## 2019-10-15 LAB — BETA HCG QUANT (REF LAB): hCG Quant: 202 m[IU]/mL

## 2019-10-15 NOTE — Progress Notes (Signed)
Needs a provider visit next week to discuss her values as she may need referral to receive chemotherapy

## 2019-10-15 NOTE — Telephone Encounter (Signed)
Called patient with assistance of [employer] # [phone].   Patient did not answer, LM for her to call the office for results. Patient needs to be scheduled for a follow up appt.next week with a provider to discuss results.    Forwarded to front office to call patient to get her scheduled for follow up. Patient prefers to be called after 4 pm due to work schedule.

## 2019-10-15 NOTE — Telephone Encounter (Signed)
-----   Message from James G Arnold, MD sent at 10/15/2019 12:07 PM EDT ----- Needs a provider visit next week to discuss her values as she may need referral to receive chemotherapy 

## 2019-10-15 NOTE — Progress Notes (Signed)
Patient was assessed and managed by nursing staff during this encounter. I have reviewed the chart and agree with the documentation and plan. I have also made any necessary editorial changes.  Warden Fillers, MD 10/15/2019 8:07 AM

## 2019-10-18 ENCOUNTER — Telehealth: Payer: Self-pay | Admitting: Family Medicine

## 2019-10-18 ENCOUNTER — Telehealth (INDEPENDENT_AMBULATORY_CARE_PROVIDER_SITE_OTHER): Payer: Medicaid Other | Admitting: Lactation Services

## 2019-10-18 DIAGNOSIS — O0889 Other complications following an ectopic and molar pregnancy: Secondary | ICD-10-CM

## 2019-10-18 NOTE — Telephone Encounter (Signed)
Called patient with assistance of [facility] Telephone [place] #[phone]. Calling with results of Hcg and need for follow up appt.   Called patients mobile number and has been disconnected. Called home phone # listed. LM for patient to call the office for results and appt.   Message to front office to schedule and appt and send letter to patient.

## 2019-10-18 NOTE — Telephone Encounter (Signed)
Attempted to contact patient to get her scheduled for f/u after molar pregnancy. No answer, left voicemail for patient to give the office a call back to be scheduled.

## 2019-10-18 NOTE — Telephone Encounter (Signed)
-----   Message from Adam Phenix, MD sent at 10/15/2019 12:07 PM EDT ----- Needs a provider visit next week to discuss her values as she may need referral to receive chemotherapy

## 2019-10-21 ENCOUNTER — Ambulatory Visit: Payer: Medicaid Other | Admitting: Family Medicine

## 2019-10-21 NOTE — Telephone Encounter (Addendum)
Message was sent to front office last week, they have not been able to reach patient to schedule appt.   Called patient again today with assistance of [facility] Telephone [employer] # [phone].   Was able to reach patient. Patient informed that her levels have dropped very little and that patient needs to return for follow up appt to determine if further treatment is needed. Patient voiced understanding.   Appointment scheduled for 8/12 at 2:35, patient reports she cannot make it, offered Tuesday at 1 pm, patient says ok, she will come to appt.

## 2019-10-26 ENCOUNTER — Encounter: Payer: Self-pay | Admitting: Obstetrics and Gynecology

## 2019-10-26 ENCOUNTER — Other Ambulatory Visit: Payer: Self-pay

## 2019-10-26 ENCOUNTER — Ambulatory Visit (INDEPENDENT_AMBULATORY_CARE_PROVIDER_SITE_OTHER): Payer: Medicaid Other | Admitting: Obstetrics and Gynecology

## 2019-10-26 VITALS — BP 128/83 | HR 80 | Ht 65.0 in | Wt 179.9 lb

## 2019-10-26 DIAGNOSIS — Z789 Other specified health status: Secondary | ICD-10-CM | POA: Insufficient documentation

## 2019-10-26 DIAGNOSIS — O0889 Other complications following an ectopic and molar pregnancy: Secondary | ICD-10-CM

## 2019-10-26 DIAGNOSIS — O021 Missed abortion: Secondary | ICD-10-CM

## 2019-10-26 DIAGNOSIS — N939 Abnormal uterine and vaginal bleeding, unspecified: Secondary | ICD-10-CM

## 2019-10-26 DIAGNOSIS — N261 Atrophy of kidney (terminal): Secondary | ICD-10-CM

## 2019-10-26 DIAGNOSIS — R93422 Abnormal radiologic findings on diagnostic imaging of left kidney: Secondary | ICD-10-CM

## 2019-10-26 DIAGNOSIS — Z9889 Other specified postprocedural states: Secondary | ICD-10-CM

## 2019-10-26 DIAGNOSIS — Z603 Acculturation difficulty: Secondary | ICD-10-CM

## 2019-10-26 NOTE — Progress Notes (Signed)
Pt states is having mild bleeding.

## 2019-10-26 NOTE — Patient Instructions (Signed)
Molar Pregnancy  A molar pregnancy (hydatidiform mole) is a mass of tissue that grows in the uterus after an egg is fertilized incorrectly. The mass does not develop into a fetus, and is considered an abnormal pregnancy. Usually, the pregnancy ends on its own through miscarriage. In some cases, treatment may be required. What are the causes? This condition is caused by an egg that is fertilized incorrectly so that it has abnormal genetic material (chromosomes). This can result in one of two types of molar pregnancies:  Complete molar pregnancy. This is when all of the chromosomes in the fertilized egg are from the father, and none are from the mother.  Partial molar pregnancy. This is when the fertilized egg has chromosomes from the father and mother, but it has too many chromosomes. What increases the risk? This condition is more likely to develop in:  Women who are over the age of 57 or under the age of 42.  Women who have had a molar pregnancy in the past (very rare). Other possible risk factors include:  Smoking more than 15 cigarettes a day.  History of infertility.  Having a blood type A, B, or AB.  Having a lack (deficiency) of vitamin A.  Using birth control pills (oral contraceptives). What are the signs or symptoms? Symptoms of this condition include:  Vaginal bleeding.  Missed menstrual period.  The uterus growing faster than expected for a normal pregnancy.  Severe nausea and vomiting.  Severe pressure or pain in the uterus.  Abnormal ovarian cysts (theca lutein cysts).  Vaginal discharge that looks like grapes.  High blood pressure (early onset of preeclampsia).  Overactive thyroid gland (hyperthyroidism).  Not having enough red blood cells or hemoglobin (anemia). How is this diagnosed? This condition is diagnosed based on ultrasound and blood tests. How is this treated? Usually, molar pregnancies end on their own by miscarriage. A health care provider  may manage this condition by:  Monitoring the levels of pregnancy hormones in your blood to make sure that the hormone levels are decreasing as expected.  Giving you a medicine called Rho (D) immune globulin. This medicine helps to prevent problems that may occur in future pregnancies as a result of a protein on red blood cells (Rh factor). You may be given this medicine if you do not have an Rh factor (you are Rh negative) and your sex partner has an Rh factor (he is Rh positive).  Putting you on chemotherapy. This involves taking medicines that regulate levels of pregnancy hormones. This may be done if your pregnancy hormone levels are not decreasing as expected.  Performing a procedure called dilation and curettage (D&C), or vacuum curettage. These are minor procedures that involve scraping or suctioning the molar pregnancy out of the uterus and removing it through the vagina. Even if a molar pregnancy ends on its own, one of these procedures may be done to make sure that all the abnormal tissue is out of the uterus.  Doing a surgical removal of the uterus (hysterectomy). Follow these instructions at home:  Avoid getting pregnant for 6-12 months, or as long as told by your health care provider. To avoid getting pregnant, avoid having sex or use a reliable form of birth control every time you have sex.  Take over-the-counter and prescription medicines only as told by your health care provider.  Rest as needed, and slowly return to your normal activities.  Think about joining a support group. If you are struggling with grief, ask  your health care provider for help.  Keep all follow-up visits as told by your health care provider. This is important. You may need follow-up blood tests or ultrasounds. Contact a health care provider if:  You continue to have irregular vaginal bleeding.  You have abdominal pain. Summary  A molar pregnancy (hydatidiform mole) is a mass of tissue that grows in  the uterus after an egg is fertilized incorrectly.  This condition is more likely to develop in women who are over the age of 76 or under the age of 45 or women who have had a molar pregnancy in the past.  The most common symptom of this condition is vaginal bleeding.  Usually, molar pregnancy ends with a miscarriage, and no treatment is needed. This information is not intended to replace advice given to you by your health care provider. Make sure you discuss any questions you have with your health care provider. Document Revised: 02/07/2017 Document Reviewed: 05/01/2016 Elsevier Patient Education  2020 [employer].

## 2019-10-26 NOTE — Progress Notes (Signed)
   Subjective:    Patient ID: Jackie Nelson is a 39 y.o. female presenting with Follow-up  of partial molar pregnancy on 10/26/2019.  HPI: Pt seen for follow up from D and E secondary to molar pregnancy.  Pt has done well.  Her only complaint is irregular light bleeding; however, pt did receive a dose of depo-provera.  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  Previous bhcg was 202 on 10/14/2019.  Slow decrease in bhcg from 232 to 202  Review of Systems  Constitutional: Negative.   HENT: Negative.   Eyes: Negative.   Respiratory: Negative.   Cardiovascular: Negative.   Gastrointestinal: Negative.   Endocrine: Negative.   Genitourinary: Positive for vaginal bleeding.  Musculoskeletal: Negative.   Neurological: Negative.   Psychiatric/Behavioral: Negative.       Objective:    BP 128/83   Pulse 80   Ht 5\' 5"  (1.651 m)   Wt 179 lb 14.4 oz (81.6 kg)   BMI 29.94 kg/m  Physical Exam Constitutional:      General: She is not in acute distress.    Appearance: Normal appearance. She is normal weight.  HENT:     Head: Normocephalic and atraumatic.  Eyes:     Extraocular Movements: Extraocular movements intact.  Cardiovascular:     Rate and Rhythm: Normal rate and regular rhythm.     Heart sounds: Normal heart sounds.  Pulmonary:     Effort: Pulmonary effort is normal.     Breath sounds: Normal breath sounds.  Abdominal:     General: Abdomen is flat.     Palpations: Abdomen is soft.     Tenderness: There is no abdominal tenderness.  Genitourinary:    Comments: SSE: very light bleeding noted, equivalent of spotting, blood was brown in color Uterus: normal size Cervix: no CMT Adnexa: WNL Musculoskeletal:        General: Normal range of motion.     Cervical back: Normal range of motion.  Skin:    General: Skin is warm and dry.  Neurological:     Mental Status: She is alert.  Psychiatric:        Mood and Affect: Mood normal.        Behavior: Behavior  normal.         Assessment & Plan:  Partial molar pregnancy:   Will repeat bhcg today.  If there is not a significant drop in the value will refer patient to gyn oncology for further management and possible chemotherapy.  Will contact patient with bhcg value and the plan  Language barrier: interpreter present  Atrophic kidney:  Pt has not had follow up with nephrology, refer back to nephrology for further care  Total face-to-face time with patient: 30 minutes. Over 50% of encounter was spent on counseling and coordination of care.    No follow-ups on file.  10/26/2019 2:12 PM

## 2019-10-27 LAB — BETA HCG QUANT (REF LAB): hCG Quant: 124 m[IU]/mL

## 2019-10-28 ENCOUNTER — Telehealth: Payer: Self-pay | Admitting: Lactation Services

## 2019-10-28 NOTE — Telephone Encounter (Signed)
-----   Message from Warden Fillers, MD sent at 10/27/2019  1:42 PM EDT ----- Spoke with representative from Dr. Andrey Farmer gyn onc, recommended continued surveillance q 1-2 weeks until normal, no need for gyn onc referral, please schedule for bchg in 1 week, lab only

## 2019-10-28 NOTE — Telephone Encounter (Signed)
Called patient with assistance of [address] Catha Nottingham # [phone].   Called home phone and patient did not answer. LM for patient to call the office.  Called the Mobile phone number listed, this phone does not accept incoming calls.   Message to front office to call patient to schedule follow up non stat Beta Hcg around 8/24.

## 2019-11-03 ENCOUNTER — Other Ambulatory Visit: Payer: Self-pay | Admitting: *Deleted

## 2019-11-03 DIAGNOSIS — O0889 Other complications following an ectopic and molar pregnancy: Secondary | ICD-10-CM

## 2019-11-04 ENCOUNTER — Other Ambulatory Visit: Payer: Medicaid Other

## 2019-11-11 ENCOUNTER — Encounter: Payer: Self-pay | Admitting: Family Medicine

## 2019-11-11 ENCOUNTER — Other Ambulatory Visit: Payer: Medicaid Other

## 2019-11-11 DIAGNOSIS — N130 Hydronephrosis with ureteropelvic junction obstruction: Secondary | ICD-10-CM | POA: Diagnosis not present

## 2019-11-11 DIAGNOSIS — N390 Urinary tract infection, site not specified: Secondary | ICD-10-CM | POA: Diagnosis not present

## 2019-11-11 DIAGNOSIS — N261 Atrophy of kidney (terminal): Secondary | ICD-10-CM | POA: Diagnosis not present

## 2019-11-18 ENCOUNTER — Other Ambulatory Visit: Payer: Medicaid Other

## 2019-11-22 ENCOUNTER — Other Ambulatory Visit: Payer: Self-pay | Admitting: Urology

## 2019-12-07 NOTE — Patient Instructions (Addendum)
DUE TO COVID-19 ONLY ONE VISITOR IS ALLOWED TO COME WITH YOU AND STAY IN THE WAITING ROOM ONLY DURING PRE OP AND PROCEDURE DAY OF SURGERY. THE 1 VISITOR  MAY VISIT WITH YOU AFTER SURGERY IN YOUR PRIVATE ROOM DURING VISITING HOURS ONLY!  YOU NEED TO HAVE A COVID 19 TEST ON___10/7____ @_8 :10______, THIS TEST MUST BE DONE BEFORE SURGERY,  COVID TESTING SITE [address] [place] , IT IS ON THE RIGHT GOING OUT [address] APPROXIMATELY  2 MINUTES PAST [employer] ON THE RIGHT. ONCE YOUR COVID TEST IS COMPLETED,  PLEASE BEGIN THE QUARANTINE INSTRUCTIONS AS OUTLINED IN YOUR HANDOUT.                [id number]    Your procedure is scheduled on: 12/20/19   Report to [facility] Main  Entrance   Report to Short Stay at 5:30 AM     Call this number if you have problems the morning of surgery [phone]    Remember: Do not eat food or drink liquids :After Midnight.   BRUSH YOUR TEETH MORNING OF SURGERY AND RINSE YOUR MOUTH OUT, NO CHEWING GUM CANDY OR MINTS.     Take these medicines the morning of surgery with A SIP OF WATER: None                                 You may not have any metal on your body including hair pins and              piercings  Do not wear jewelry, make-up, lotions, powders or perfumes, deodorant             Do not wear nail polish on your fingernails.  Do not shave  48 hours prior to surgery.              Do not bring valuables to the hospital. [facility] IS NOT             RESPONSIBLE   FOR VALUABLES.  Contacts, dentures or bridgework may not be worn into surgery.                  Please read over the following fact sheets you were given: _____________________________________________________________________             [facility] - Preparing for Surgery Before surgery, you can play an important role.   Because skin is not sterile, your skin needs to be as free of germs as possible.   You can reduce the number of  germs on your skin by washing with CHG (chlorahexidine gluconate) soap before surgery.   CHG is an antiseptic cleaner which kills germs and bonds with the skin to continue killing germs even after washing. Please DO NOT use if you have an allergy to CHG or antibacterial soaps.   If your skin becomes reddened/irritated stop using the CHG and inform your nurse when you arrive at Short Stay. Do not shave (including legs and underarms) for at least 48 hours prior to the first CHG shower.  Please follow these instructions carefully:  1.  Shower with CHG Soap the night before surgery and the  morning of Surgery.  2.  If you choose to wash your hair, wash your hair first as usual with your  normal  shampoo.  3.  After you shampoo, rinse your hair and body thoroughly to  remove the  shampoo.                                        4.  Use CHG as you would any other liquid soap.  You can apply chg directly  to the skin and wash                       Gently with a scrungie or clean washcloth.  5.  Apply the CHG Soap to your body ONLY FROM THE NECK DOWN.   Do not use on face/ open                           Wound or open sores. Avoid contact with eyes, ears mouth and genitals (private parts).                       Wash face,  Genitals (private parts) with your normal soap.             6.  Wash thoroughly, paying special attention to the area where your surgery  will be performed.  7.  Thoroughly rinse your body with warm water from the neck down.  8.  DO NOT shower/wash with your normal soap after using and rinsing off  the CHG Soap.             9.  Pat yourself dry with a clean towel.            10.  Wear clean pajamas.            11.  Place clean sheets on your bed the night of your first shower and do not  sleep with pets. Day of Surgery : Do not apply any lotions/deodorants the morning of surgery.  Please wear clean clothes to the hospital/surgery center.  FAILURE TO FOLLOW THESE INSTRUCTIONS MAY  RESULT IN THE CANCELLATION OF YOUR SURGERY PATIENT SIGNATURE_________________________________  NURSE SIGNATURE__________________________________  ________________________________________________________________________

## 2019-12-13 ENCOUNTER — Other Ambulatory Visit: Payer: Self-pay

## 2019-12-13 ENCOUNTER — Encounter (HOSPITAL_COMMUNITY): Payer: Self-pay

## 2019-12-13 ENCOUNTER — Encounter (HOSPITAL_COMMUNITY)
Admission: RE | Admit: 2019-12-13 | Discharge: 2019-12-13 | Disposition: A | Discharge status: Home / Self Care | Payer: Medicaid Other | Source: Ambulatory Visit | Attending: Urology | Admitting: Urology

## 2019-12-13 DIAGNOSIS — Z01812 Encounter for preprocedural laboratory examination: Secondary | ICD-10-CM | POA: Diagnosis not present

## 2019-12-13 LAB — BASIC METABOLIC PANEL
Anion gap: 7 (ref 5–15)
BUN: 15 mg/dL (ref 6–20)
CO2: 25 mmol/L (ref 22–32)
Calcium: 9 mg/dL (ref 8.9–10.3)
Chloride: 105 mmol/L (ref 98–111)
Creatinine, Ser: 1.04 mg/dL — ABNORMAL HIGH (ref 0.44–1.00)
GFR calc Af Amer: >60 mL/min (ref >60)
GFR calc non Af Amer: >60 mL/min (ref >60)
Glucose, Bld: 94 mg/dL (ref 70–99)
Potassium: 4.3 mmol/L (ref 3.5–5.1)
Sodium: 137 mmol/L (ref 135–145)

## 2019-12-13 LAB — CBC
HCT: 38.4 % (ref 36.0–46.0)
Hemoglobin: 12.2 g/dL (ref 12.0–15.0)
MCH: 25.9 pg — ABNORMAL LOW (ref 26.0–34.0)
MCHC: 31.8 g/dL (ref 30.0–36.0)
MCV: 81.5 fL (ref 80.0–100.0)
Platelets: 186 10*3/uL (ref 150–400)
RBC: 4.71 MIL/uL (ref 3.87–5.11)
RDW: 14.8 % (ref 11.5–15.5)
WBC: 3.7 10*3/uL — ABNORMAL LOW (ref 4.0–10.5)
nRBC: 0 % (ref 0.0–0.2)

## 2019-12-13 LAB — PROTIME-INR
INR: 1 (ref 0.8–1.2)
Prothrombin Time: 13.1 seconds (ref 11.4–15.2)

## 2019-12-13 NOTE — Progress Notes (Signed)
COVID Vaccine Completed:No Date COVID Vaccine completed: COVID vaccine manufacturer: Pfizer    Moderna   [employer]   PCP - Z. Meredeth Ide NP Cardiologist - no  Chest x-ray - no EKG - no Stress Test - no ECHO - no Cardiac Cath - no Pacemaker/ICD device last checked:NA  Sleep Study - no CPAP -   Fasting Blood Sugar - NA Checks Blood Sugar _____ times a day  Blood Thinner Instructions:NA Aspirin Instructions: Last Dose:  Anesthesia review:   Patient denies shortness of breath, fever, cough and chest pain at PAT appointment yes   Patient verbalized understanding of instructions that were given to them at the PAT appointment. Patient was also instructed that they will need to review over the PAT instructions again at home before surgery. Yes. Instructions were read by the interpreter. Pt states that she is active and has no SOB climbing stairs, doing work or with ADLs

## 2019-12-15 ENCOUNTER — Other Ambulatory Visit: Payer: Self-pay | Admitting: Urology

## 2019-12-16 ENCOUNTER — Other Ambulatory Visit (HOSPITAL_COMMUNITY)
Admission: RE | Admit: 2019-12-16 | Discharge: 2019-12-16 | Disposition: A | Discharge status: Home / Self Care | Payer: Medicaid Other | Source: Ambulatory Visit | Attending: Urology | Admitting: Urology

## 2019-12-16 DIAGNOSIS — Z01812 Encounter for preprocedural laboratory examination: Secondary | ICD-10-CM | POA: Diagnosis not present

## 2019-12-16 DIAGNOSIS — Z20822 Contact with and (suspected) exposure to covid-19: Secondary | ICD-10-CM | POA: Insufficient documentation

## 2019-12-16 LAB — SARS CORONAVIRUS 2 (TAT 6-24 HRS): SARS Coronavirus 2: NEGATIVE

## 2019-12-17 DIAGNOSIS — N130 Hydronephrosis with ureteropelvic junction obstruction: Secondary | ICD-10-CM | POA: Diagnosis not present

## 2019-12-17 DIAGNOSIS — N261 Atrophy of kidney (terminal): Secondary | ICD-10-CM | POA: Diagnosis not present

## 2019-12-17 DIAGNOSIS — R1084 Generalized abdominal pain: Secondary | ICD-10-CM | POA: Diagnosis not present

## 2019-12-19 NOTE — Anesthesia Preprocedure Evaluation (Addendum)
Anesthesia Evaluation  Patient identified by MRN, date of birth, ID band Patient awake    Reviewed: Allergy & Precautions, NPO status , Patient's Chart, lab work & pertinent test results  Airway Mallampati: II  TM Distance: >3 FB Neck ROM: Full    Dental  (+) Teeth Intact   Pulmonary neg pulmonary ROS,    Pulmonary exam normal breath sounds clear to auscultation       Cardiovascular negative cardio ROS Normal cardiovascular exam Rhythm:Regular Rate:Normal     Neuro/Psych negative neurological ROS     GI/Hepatic negative GI ROS, Neg liver ROS,   Endo/Other  negative endocrine ROS  Renal/GU Renal disease (NONFUNCTIONAL LEFT KIDNEY)     Musculoskeletal negative musculoskeletal ROS (+)   Abdominal   Peds  Hematology negative hematology ROS (+)   Anesthesia Other Findings   Reproductive/Obstetrics                            Anesthesia Physical Anesthesia Plan  ASA: II  Anesthesia Plan: General   Post-op Pain Management:    Induction: Intravenous  PONV Risk Score and Plan: 4 or greater and Midazolam, Scopolamine patch - Pre-op, Dexamethasone and Ondansetron  Airway Management Planned: Oral ETT  Additional Equipment:   Intra-op Plan:   Post-operative Plan: Extubation in OR  Informed Consent: I have reviewed the patients History and Physical, chart, labs and discussed the procedure including the risks, benefits and alternatives for the proposed anesthesia with the patient or authorized representative who has indicated his/her understanding and acceptance.     Interpreter used for [employer] Discussed with: CRNA  Anesthesia Plan Comments: (2nd PIV after induction)      Anesthesia Quick Evaluation

## 2019-12-20 ENCOUNTER — Inpatient Hospital Stay (HOSPITAL_COMMUNITY): Payer: Medicaid Other | Admitting: Certified Registered Nurse Anesthetist

## 2019-12-20 ENCOUNTER — Encounter (HOSPITAL_COMMUNITY): Payer: Self-pay | Admitting: Urology

## 2019-12-20 ENCOUNTER — Encounter (HOSPITAL_COMMUNITY)
Admission: RE | Disposition: A | Discharge status: Home / Self Care | Payer: Self-pay | Source: Home / Self Care | Attending: Urology

## 2019-12-20 ENCOUNTER — Inpatient Hospital Stay (HOSPITAL_COMMUNITY)
Admission: RE | Admit: 2019-12-20 | Discharge: 2019-12-21 | DRG: 661 | Disposition: A | Discharge status: Home / Self Care | Payer: Medicaid Other | Attending: Urology | Admitting: Urology

## 2019-12-20 ENCOUNTER — Other Ambulatory Visit: Payer: Self-pay

## 2019-12-20 DIAGNOSIS — N261 Atrophy of kidney (terminal): Principal | ICD-10-CM | POA: Diagnosis present

## 2019-12-20 DIAGNOSIS — N289 Disorder of kidney and ureter, unspecified: Secondary | ICD-10-CM

## 2019-12-20 HISTORY — PX: LAPAROSCOPIC NEPHRECTOMY, HAND ASSISTED: SHX1929

## 2019-12-20 LAB — BASIC METABOLIC PANEL
Anion gap: 9 (ref 5–15)
BUN: 13 mg/dL (ref 6–20)
CO2: 23 mmol/L (ref 22–32)
Calcium: 8.5 mg/dL — ABNORMAL LOW (ref 8.9–10.3)
Chloride: 106 mmol/L (ref 98–111)
Creatinine, Ser: 1 mg/dL (ref 0.44–1.00)
GFR, Estimated: >60 mL/min (ref >60)
Glucose, Bld: 164 mg/dL — ABNORMAL HIGH (ref 70–99)
Potassium: 3.7 mmol/L (ref 3.5–5.1)
Sodium: 138 mmol/L (ref 135–145)

## 2019-12-20 LAB — HEMOGLOBIN AND HEMATOCRIT, BLOOD
HCT: 35.4 % — ABNORMAL LOW (ref 36.0–46.0)
Hemoglobin: 11.2 g/dL — ABNORMAL LOW (ref 12.0–15.0)

## 2019-12-20 LAB — TYPE AND SCREEN
ABO/RH(D): O POS
Antibody Screen: NEGATIVE

## 2019-12-20 LAB — PREGNANCY, URINE: Preg Test, Ur: NEGATIVE

## 2019-12-20 SURGERY — NEPHRECTOMY, HAND-ASSISTED, LAPAROSCOPIC
Anesthesia: General | Laterality: Left

## 2019-12-20 MED ORDER — FENTANYL CITRATE (PF) 100 MCG/2ML IJ SOLN
INTRAMUSCULAR | Status: AC
  Filled 2019-12-20: qty 2

## 2019-12-20 MED ORDER — ONDANSETRON HCL 4 MG/2ML IJ SOLN
INTRAMUSCULAR | Status: AC
  Filled 2019-12-20: qty 2

## 2019-12-20 MED ORDER — PROPOFOL 10 MG/ML IV BOLUS
INTRAVENOUS | Status: DC | PRN
  Administered 2019-12-20: 180 mg via INTRAVENOUS

## 2019-12-20 MED ORDER — SODIUM CHLORIDE (PF) 0.9 % IJ SOLN
INTRAMUSCULAR | Status: DC | PRN
  Administered 2019-12-20: 20 mL

## 2019-12-20 MED ORDER — CEFAZOLIN SODIUM-DEXTROSE 2-4 GM/100ML-% IV SOLN
2.0000 g | INTRAVENOUS | Status: AC
  Administered 2019-12-20: 2 g via INTRAVENOUS
  Filled 2019-12-20: qty 100

## 2019-12-20 MED ORDER — CHLORHEXIDINE GLUCONATE CLOTH 2 % EX PADS
6.0000 | MEDICATED_PAD | Freq: Every day | CUTANEOUS | Status: DC
  Administered 2019-12-20: 6 via TOPICAL

## 2019-12-20 MED ORDER — LIDOCAINE 2% (20 MG/ML) 5 ML SYRINGE
INTRAMUSCULAR | Status: DC | PRN
  Administered 2019-12-20: 60 mg via INTRAVENOUS

## 2019-12-20 MED ORDER — SENNOSIDES-DOCUSATE SODIUM 8.6-50 MG PO TABS
2.0000 | ORAL_TABLET | Freq: Every day | ORAL | Status: DC
  Administered 2019-12-20: 2 via ORAL
  Filled 2019-12-20 (×2): qty 2

## 2019-12-20 MED ORDER — MIDAZOLAM HCL 5 MG/5ML IJ SOLN
INTRAMUSCULAR | Status: DC | PRN
  Administered 2019-12-20: 2 mg via INTRAVENOUS

## 2019-12-20 MED ORDER — LIDOCAINE 2% (20 MG/ML) 5 ML SYRINGE
INTRAMUSCULAR | Status: AC
  Filled 2019-12-20: qty 5

## 2019-12-20 MED ORDER — PHENYLEPHRINE HCL-NACL 10-0.9 MG/250ML-% IV SOLN
INTRAVENOUS | Status: AC
  Filled 2019-12-20: qty 250

## 2019-12-20 MED ORDER — ACETAMINOPHEN 500 MG PO TABS
1000.0000 mg | ORAL_TABLET | Freq: Once | ORAL | Status: AC
  Administered 2019-12-20: 1000 mg via ORAL
  Filled 2019-12-20: qty 2

## 2019-12-20 MED ORDER — LACTATED RINGERS IV SOLN: INTRAVENOUS | Status: DC

## 2019-12-20 MED ORDER — HYDROMORPHONE HCL 1 MG/ML IJ SOLN
INTRAMUSCULAR | Status: AC
  Filled 2019-12-20: qty 1

## 2019-12-20 MED ORDER — ONDANSETRON HCL 4 MG/2ML IJ SOLN
INTRAMUSCULAR | Status: DC | PRN
  Administered 2019-12-20: 4 mg via INTRAVENOUS

## 2019-12-20 MED ORDER — DOCUSATE SODIUM 100 MG PO CAPS
100.0000 mg | ORAL_CAPSULE | Freq: Two times a day (BID) | ORAL | Status: DC
  Administered 2019-12-20 – 2019-12-21 (×3): 100 mg via ORAL
  Filled 2019-12-20 (×4): qty 1

## 2019-12-20 MED ORDER — FENTANYL CITRATE (PF) 100 MCG/2ML IJ SOLN
INTRAMUSCULAR | Status: AC
  Administered 2019-12-20: 50 ug via INTRAVENOUS
  Filled 2019-12-20: qty 2

## 2019-12-20 MED ORDER — HYDRALAZINE HCL 20 MG/ML IJ SOLN
INTRAMUSCULAR | Status: DC | PRN
  Administered 2019-12-20 (×2): 2 mg via INTRAVENOUS

## 2019-12-20 MED ORDER — FENTANYL CITRATE (PF) 250 MCG/5ML IJ SOLN
INTRAMUSCULAR | Status: AC
  Filled 2019-12-20: qty 5

## 2019-12-20 MED ORDER — LABETALOL HCL 5 MG/ML IV SOLN
INTRAVENOUS | Status: DC | PRN
  Administered 2019-12-20: 5 mg via INTRAVENOUS

## 2019-12-20 MED ORDER — FENTANYL CITRATE (PF) 250 MCG/5ML IJ SOLN
INTRAMUSCULAR | Status: DC | PRN
  Administered 2019-12-20 (×3): 50 ug via INTRAVENOUS
  Administered 2019-12-20: 100 ug via INTRAVENOUS

## 2019-12-20 MED ORDER — PHENYLEPHRINE 40 MCG/ML (10ML) SYRINGE FOR IV PUSH (FOR BLOOD PRESSURE SUPPORT)
PREFILLED_SYRINGE | INTRAVENOUS | Status: DC | PRN
  Administered 2019-12-20: 80 ug via INTRAVENOUS

## 2019-12-20 MED ORDER — MIDAZOLAM HCL 2 MG/2ML IJ SOLN
INTRAMUSCULAR | Status: AC
  Filled 2019-12-20: qty 2

## 2019-12-20 MED ORDER — SODIUM CHLORIDE (PF) 0.9 % IJ SOLN
INTRAMUSCULAR | Status: AC
  Filled 2019-12-20: qty 20

## 2019-12-20 MED ORDER — LACTATED RINGERS IV SOLN: INTRAVENOUS | Status: DC | PRN

## 2019-12-20 MED ORDER — PROPOFOL 10 MG/ML IV BOLUS
INTRAVENOUS | Status: AC
  Filled 2019-12-20: qty 20

## 2019-12-20 MED ORDER — ONDANSETRON HCL 4 MG/2ML IJ SOLN: 4.0000 mg | INTRAMUSCULAR | Status: DC | PRN

## 2019-12-20 MED ORDER — LIDOCAINE 2% (20 MG/ML) 5 ML SYRINGE
INTRAMUSCULAR | Status: DC | PRN
  Administered 2019-12-20: 1.5 mg/kg/h via INTRAVENOUS

## 2019-12-20 MED ORDER — OXYCODONE HCL 5 MG PO TABS
5.0000 mg | ORAL_TABLET | ORAL | Status: DC | PRN
  Administered 2019-12-21: 5 mg via ORAL
  Filled 2019-12-20: qty 1

## 2019-12-20 MED ORDER — SCOPOLAMINE 1 MG/3DAYS TD PT72
1.0000 | MEDICATED_PATCH | Freq: Once | TRANSDERMAL | Status: DC
  Administered 2019-12-20: 1.5 mg via TRANSDERMAL
  Filled 2019-12-20: qty 1

## 2019-12-20 MED ORDER — CHLORHEXIDINE GLUCONATE 0.12 % MT SOLN
15.0000 mL | Freq: Once | OROMUCOSAL | Status: AC
  Administered 2019-12-20: 15 mL via OROMUCOSAL

## 2019-12-20 MED ORDER — SODIUM CHLORIDE 0.9 % IV SOLN
INTRAVENOUS | Status: DC
  Administered 2019-12-20: 1000 mL via INTRAVENOUS

## 2019-12-20 MED ORDER — KETAMINE HCL 10 MG/ML IJ SOLN
INTRAMUSCULAR | Status: DC | PRN
  Administered 2019-12-20: 30 mg via INTRAVENOUS

## 2019-12-20 MED ORDER — FENTANYL CITRATE (PF) 100 MCG/2ML IJ SOLN
25.0000 ug | INTRAMUSCULAR | Status: DC | PRN
  Administered 2019-12-20 (×2): 50 ug via INTRAVENOUS

## 2019-12-20 MED ORDER — DEXAMETHASONE SODIUM PHOSPHATE 10 MG/ML IJ SOLN
INTRAMUSCULAR | Status: DC | PRN
  Administered 2019-12-20: 6 mg via INTRAVENOUS

## 2019-12-20 MED ORDER — BUPIVACAINE LIPOSOME 1.3 % IJ SUSP
20.0000 mL | Freq: Once | INTRAMUSCULAR | Status: AC
  Administered 2019-12-20: 20 mL
  Filled 2019-12-20: qty 20

## 2019-12-20 MED ORDER — ROCURONIUM BROMIDE 10 MG/ML (PF) SYRINGE
PREFILLED_SYRINGE | INTRAVENOUS | Status: DC | PRN
  Administered 2019-12-20: 70 mg via INTRAVENOUS

## 2019-12-20 MED ORDER — PROMETHAZINE HCL 25 MG/ML IJ SOLN: 6.2500 mg | INTRAMUSCULAR | Status: DC | PRN

## 2019-12-20 MED ORDER — STERILE WATER FOR IRRIGATION IR SOLN
Status: DC | PRN
  Administered 2019-12-20: 1000 mL

## 2019-12-20 MED ORDER — ACETAMINOPHEN 500 MG PO TABS
1000.0000 mg | ORAL_TABLET | Freq: Four times a day (QID) | ORAL | Status: AC
  Administered 2019-12-20 – 2019-12-21 (×3): 1000 mg via ORAL
  Filled 2019-12-20 (×3): qty 2

## 2019-12-20 MED ORDER — KETAMINE HCL 10 MG/ML IJ SOLN
INTRAMUSCULAR | Status: AC
  Filled 2019-12-20: qty 1

## 2019-12-20 MED ORDER — HYDROMORPHONE HCL 1 MG/ML IJ SOLN
0.5000 mg | INTRAMUSCULAR | Status: DC | PRN
  Administered 2019-12-20: 1 mg via INTRAVENOUS
  Administered 2019-12-20: 0.5 mg via INTRAVENOUS
  Administered 2019-12-20 – 2019-12-21 (×2): 1 mg via INTRAVENOUS
  Filled 2019-12-20 (×3): qty 1

## 2019-12-20 MED ORDER — LACTATED RINGERS IR SOLN
Status: DC | PRN
  Administered 2019-12-20: 1000 mL

## 2019-12-20 MED ORDER — GABAPENTIN 300 MG PO CAPS
300.0000 mg | ORAL_CAPSULE | Freq: Once | ORAL | Status: AC
  Administered 2019-12-20: 300 mg via ORAL
  Filled 2019-12-20: qty 1

## 2019-12-20 MED ORDER — ORAL CARE MOUTH RINSE: 15.0000 mL | Freq: Once | OROMUCOSAL | Status: AC

## 2019-12-20 MED ORDER — DEXAMETHASONE SODIUM PHOSPHATE 10 MG/ML IJ SOLN
INTRAMUSCULAR | Status: AC
  Filled 2019-12-20: qty 1

## 2019-12-20 MED ORDER — SUGAMMADEX SODIUM 200 MG/2ML IV SOLN
INTRAVENOUS | Status: DC | PRN
  Administered 2019-12-20: 200 mg via INTRAVENOUS

## 2019-12-20 MED ORDER — ROCURONIUM BROMIDE 10 MG/ML (PF) SYRINGE
PREFILLED_SYRINGE | INTRAVENOUS | Status: AC
  Filled 2019-12-20: qty 10

## 2019-12-20 SURGICAL SUPPLY — 62 items
BAG LAPAROSCOPIC 12 15 PORT 16 (BASKET) ×1 IMPLANT
BAG RETRIEVAL 12/15 (BASKET) ×2
BAG RETRIEVAL 12/15MM (BASKET) ×1
BAG ZIPLOCK 12X15 (MISCELLANEOUS) IMPLANT
BLADE EXTENDED COATED 6.5IN (ELECTRODE) IMPLANT
BLADE SURG SZ10 CARB STEEL (BLADE) IMPLANT
CABLE HIGH FREQUENCY MONO STRZ (ELECTRODE) ×3 IMPLANT
CHLORAPREP W/TINT 26 (MISCELLANEOUS) ×3 IMPLANT
CLIP VESOLOCK LG 6/CT PURPLE (CLIP) ×3 IMPLANT
CLIP VESOLOCK MED LG 6/CT (CLIP) ×3 IMPLANT
CLIP VESOLOCK XL 6/CT (CLIP) ×3 IMPLANT
COVER SURGICAL LIGHT HANDLE (MISCELLANEOUS) ×3 IMPLANT
COVER WAND RF STERILE (DRAPES) IMPLANT
CUTTER ECHEON FLEX ENDO 45 340 (ENDOMECHANICALS) ×3 IMPLANT
DERMABOND ADVANCED (GAUZE/BANDAGES/DRESSINGS) ×2
DERMABOND ADVANCED .7 DNX12 (GAUZE/BANDAGES/DRESSINGS) ×1 IMPLANT
DRAIN CHANNEL 10F 3/8 F FF (DRAIN) IMPLANT
DRAPE INCISE IOBAN 66X45 STRL (DRAPES) ×3 IMPLANT
DRSG TEGADERM 4X4.75 (GAUZE/BANDAGES/DRESSINGS) IMPLANT
ELECT REM PT RETURN 15FT ADLT (MISCELLANEOUS) ×3 IMPLANT
EVACUATOR SILICONE 100CC (DRAIN) IMPLANT
GLOVE BIO SURGEON STRL SZ 6.5 (GLOVE) ×2 IMPLANT
GLOVE BIO SURGEONS STRL SZ 6.5 (GLOVE) ×1
GLOVE BIOGEL M STRL SZ7.5 (GLOVE) ×3 IMPLANT
GOWN STRL REUS W/TWL LRG LVL3 (GOWN DISPOSABLE) ×6 IMPLANT
HEMOSTAT SURGICEL 4X8 (HEMOSTASIS) ×3 IMPLANT
IRRIG SUCT STRYKERFLOW 2 WTIP (MISCELLANEOUS) ×3
IRRIGATION SUCT STRKRFLW 2 WTP (MISCELLANEOUS) ×1 IMPLANT
KIT BASIN OR (CUSTOM PROCEDURE TRAY) ×3 IMPLANT
KIT TURNOVER KIT A (KITS) IMPLANT
LIGASURE VESSEL 5MM BLUNT TIP (ELECTROSURGICAL) ×3 IMPLANT
NEEDLE INSUFFLATION 14GA 120MM (NEEDLE) ×3 IMPLANT
POUCH SPECIMEN RETRIEVAL 10MM (ENDOMECHANICALS) IMPLANT
RETRACTOR LAPSCP 12X46 CVD (ENDOMECHANICALS) IMPLANT
RTRCTR LAPSCP 12X46 CVD (ENDOMECHANICALS)
SCISSORS LAP 5X35 DISP (ENDOMECHANICALS) ×3 IMPLANT
SET TUBE SMOKE EVAC HIGH FLOW (TUBING) ×3 IMPLANT
STAPLE RELOAD 45 GRN (STAPLE) IMPLANT
STAPLE RELOAD 45 WHT (STAPLE) ×2 IMPLANT
STAPLE RELOAD 45MM GREEN (STAPLE)
STAPLE RELOAD 45MM WHITE (STAPLE) ×4
SURGIFLO W/THROMBIN 8M KIT (HEMOSTASIS) IMPLANT
SUT ETHILON 3 0 PS 1 (SUTURE) IMPLANT
SUT MNCRL AB 4-0 PS2 18 (SUTURE) ×6 IMPLANT
SUT PDS AB 0 CT1 36 (SUTURE) IMPLANT
SUT PDS AB 1 CTX 36 (SUTURE) ×6 IMPLANT
SUT PDS AB 1 TP1 96 (SUTURE) IMPLANT
SUT VIC AB 2-0 CT1 27 (SUTURE) ×4
SUT VIC AB 2-0 CT1 TAPERPNT 27 (SUTURE) ×2 IMPLANT
SUT VIC AB 2-0 SH 27 (SUTURE)
SUT VIC AB 2-0 SH 27X BRD (SUTURE) IMPLANT
SUT VICRYL 0 UR6 27IN ABS (SUTURE) ×6 IMPLANT
SYS LAPSCP GELPORT 120MM (MISCELLANEOUS)
SYSTEM LAPSCP GELPORT 120MM (MISCELLANEOUS) IMPLANT
TOWEL OR 17X26 10 PK STRL BLUE (TOWEL DISPOSABLE) ×3 IMPLANT
TRAY FOLEY MTR SLVR 14FR STAT (SET/KITS/TRAYS/PACK) ×3 IMPLANT
TRAY FOLEY MTR SLVR 16FR STAT (SET/KITS/TRAYS/PACK) IMPLANT
TRAY LAPAROSCOPIC (CUSTOM PROCEDURE TRAY) ×3 IMPLANT
TROCAR BLADELESS OPT 5 100 (ENDOMECHANICALS) IMPLANT
TROCAR UNIVERSAL OPT 12M 100M (ENDOMECHANICALS) ×6 IMPLANT
TROCAR XCEL 12X100 BLDLESS (ENDOMECHANICALS) ×3 IMPLANT
YANKAUER SUCT BULB TIP 10FT TU (MISCELLANEOUS) IMPLANT

## 2019-12-20 NOTE — Anesthesia Procedure Notes (Addendum)
Procedure Name: Intubation Date/Time: 12/20/2019 7:45 AM Performed by: West Pugh, CRNA Pre-anesthesia Checklist: Patient identified, Emergency Drugs available, Suction available, Patient being monitored and Timeout performed Patient Re-evaluated:Patient Re-evaluated prior to induction Oxygen Delivery Method: Circle system utilized Preoxygenation: Pre-oxygenation with 100% oxygen Induction Type: IV induction Ventilation: Mask ventilation without difficulty Laryngoscope Size: Mac and 3 Tube type: Oral Tube size: 7.0 mm Number of attempts: 1 Airway Equipment and Method: Stylet Placement Confirmation: ETT inserted through vocal cords under direct vision,  positive ETCO2 and breath sounds checked- equal and bilateral Secured at: 22 cm Tube secured with: Tape Dental Injury: Teeth and Oropharynx as per pre-operative assessment

## 2019-12-20 NOTE — Anesthesia Postprocedure Evaluation (Signed)
Anesthesia Post Note  Patient: Jackie Nelson  Procedure(s) Performed: LEFT LAPAROSCOPIC NEPHRECTOMY (Left )     Patient location during evaluation: PACU Anesthesia Type: General Level of consciousness: awake and alert Pain management: pain level controlled Vital Signs Assessment: post-procedure vital signs reviewed and stable Respiratory status: spontaneous breathing, nonlabored ventilation and respiratory function stable Cardiovascular status: blood pressure returned to baseline and stable Postop Assessment: no apparent nausea or vomiting Anesthetic complications: no   No complications documented.  Last Vitals:  Vitals:   12/20/19 1100 12/20/19 1115  BP: 112/65 112/72  Pulse: 72 72  Resp: 14 13  Temp:    SpO2: 100% 100%    Last Pain:  Vitals:   12/20/19 1115  TempSrc:   PainSc: Asleep                 Cecile Hearing

## 2019-12-20 NOTE — Transfer of Care (Signed)
Immediate Anesthesia Transfer of Care Note  Patient: Jackie Nelson  Procedure(s) Performed: LEFT LAPAROSCOPIC NEPHRECTOMY (Left )  Patient Location: PACU  Anesthesia Type:General  Level of Consciousness: awake, drowsy and patient cooperative  Airway & Oxygen Therapy: Patient Spontanous Breathing and Patient connected to face mask oxygen  Post-op Assessment: Report given to RN and Post -op Vital signs reviewed and stable  Post vital signs: Reviewed and stable  Last Vitals:  Vitals Value Taken Time  BP 155/86 12/20/19 0939  Temp 36.9 C 12/20/19 0939  Pulse 83 12/20/19 0942  Resp 17 12/20/19 0942  SpO2 100 % 12/20/19 0942  Vitals shown include unvalidated device data.  Last Pain:  Vitals:   12/20/19 0625  TempSrc:   PainSc: 5          Complications: No complications documented.

## 2019-12-20 NOTE — H&P (Signed)
Office Visit Report     12/17/2019   --------------------------------------------------------------------------------   Jackie Nelson  MRN: [id number]  DOB: 03/27/1980, 39 year old Female  SSN:    PRIMARY CARE:    REFERRING:  Latrelle Dodrill. Octavia Heir,   PROVIDER:  Rutherford Nail, M.D.  TREATING:  Rhoderick Moody, M.D.  LOCATION:  [facility] [phone]     --------------------------------------------------------------------------------   CC/HPI: Atrophic left kidney and flank pain   The patient is a 39 year old female, previously followed by Dr. 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 Recent CT showed a completely atrophic left kidney with little to no renal parenchyma. She had a LASIK renogram on 09/28/2019 that showed the left kidney was nonfunctional with a normally draining right kidney. She denies any prior abdominal surgeries. No prior history of kidney stones, dysuria or hematuria.     ALLERGIES: None   MEDICATIONS: None   GU PSH: None   NON-GU PSH: None   GU PMH: Atrophy of kidney - 09/07/2019 Flank Pain - 09/07/2019 Hydronephrosis - 09/07/2019    NON-GU PMH: None   FAMILY HISTORY: Death of family member - Father, Mother   SOCIAL HISTORY: Marital Status: Married Preferred Language: [place] Current Smoking Status: Patient has never smoked.   Tobacco Use Assessment Completed: Used Tobacco in last 30 days? Has never drank.  Does not drink caffeine.    REVIEW OF SYSTEMS:    GU Review Female:   Patient denies frequent urination, hard to postpone urination, burning /pain with urination, get up at night to urinate, leakage of urine, stream starts and stops, trouble starting your stream, have to strain to urinate, and being pregnant.  Gastrointestinal (Upper):   Patient denies nausea, vomiting, and indigestion/ heartburn.  Gastrointestinal (Lower):   Patient denies diarrhea and constipation.   Constitutional:   Patient denies fever, night sweats, weight loss, and fatigue.  Skin:   Patient denies skin rash/ lesion and itching.  Eyes:   Patient denies blurred vision and double vision.  Ears/ Nose/ Throat:   Patient denies sore throat and sinus problems.  Hematologic/Lymphatic:   Patient denies swollen glands and easy bruising.  Cardiovascular:   Patient denies leg swelling and chest pains.  Respiratory:   Patient denies cough and shortness of breath.  Endocrine:   Patient denies excessive thirst.  Musculoskeletal:   Patient denies joint pain and back pain.  Neurological:   Patient denies headaches and dizziness.  Psychologic:   Patient denies depression and anxiety.   VITAL SIGNS:      12/17/2019 12:01 PM  Weight 184 lb / 83.46 kg  Height 63 in / 160.02 cm  BP 146/85 mmHg  Heart Rate 84 /min  Temperature 97.7 F / 36.5 C  BMI 32.6 kg/m   MULTI-SYSTEM PHYSICAL EXAMINATION:    Constitutional: Well-nourished. No physical deformities. Normally developed. Good grooming.  Respiratory: No labored breathing, no use of accessory muscles.   Cardiovascular: Normal temperature, normal extremity pulses, no swelling, no varicosities.  Skin: No paleness, no jaundice, no cyanosis. No lesion, no ulcer, no rash.  Neurologic / Psychiatric: Oriented to time, oriented to place, oriented to person. No depression, no anxiety, no agitation.  Gastrointestinal: No mass, no tenderness, no rigidity, non obese abdomen.  Musculoskeletal: Normal gait and station of head and neck.     Complexity of Data:  Records Review:   Previous Doctor Records  X-Ray Review: C.T. Abdomen/Pelvis: Reviewed Films. Reviewed  Report. Discussed With Patient.  Lasix Renogram: Reviewed Films. Reviewed Report. Discussed With Patient.    Notes:                     CLINICAL DATA: Left renal atrophy, flank pain, left-sided  hydroureteronephrosis on prior CT     EXAM:  NUCLEAR MEDICINE RENAL SCAN WITH DIURETIC ADMINISTRATION      TECHNIQUE:  Radionuclide angiographic and sequential renal images were obtained  after intravenous injection of radiopharmaceutical. Imaging was  continued during slow intravenous injection of Lasix approximately  15 minutes after the start of the examination.     RADIOPHARMACEUTICALS: 5.4 mCi Technetium-68m MAG3 IV     COMPARISON: 09/07/2019     FINDINGS:  Flow: Prompt normal flow to the right kidney. Minimal flow to the  left kidney consistent with severe left renal atrophy seen on prior  CT.     Left renogram: There is minimal uptake in excretion of radiotracer  from the left kidney, with a flat renogram curve.     Right renogram: Normal uptake and excretion of radiotracer from the  right kidney, with normal renogram curve.     Differential:     Left kidney = 12 %     Right kidney = 88 %     T1/2 post Lasix :     Left kidney = N/a     Right kidney = 10.6 min     IMPRESSION:  1. Minimal function of the left kidney compatible with severe left  renal cortical atrophy identified on prior CT. Only 12% of  radiotracer uptake attributed to the left kidney, with no  appreciable excretion or response to Lasix.  2. Normal appearance and function of the right kidney.        Electronically Signed  By: Sharlet Salina M.D.  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 Her left kidney is non-functional and likely the source of her ongoing left sided flank pain. The risks of laparoscopic left simple nephrectomy were discussed in detail including but  not limited to: open conversion, infection of the skin/abdominal cavity, continuation of her pain, VTE, MI/CVA, lymphatic leak, injury to adjacent solid/hollow viscus organs, bleeding requiring a blood transfusion, catastrophic bleeding, hernia formation and other imponderables. The patient voices understanding and wishes to proceed.

## 2019-12-20 NOTE — Plan of Care (Signed)
  Problem: Education: Goal: Required Educational Video(s) Outcome: Progressing   Problem: Clinical Measurements: Goal: Ability to maintain clinical measurements within normal limits will improve Outcome: Progressing Goal: Postoperative complications will be avoided or minimized Outcome: Progressing   Problem: Skin Integrity: Goal: Demonstration of wound healing without infection will improve Outcome: Progressing   

## 2019-12-20 NOTE — Op Note (Signed)
Operative Note  Preoperative diagnosis:  1.  Atrophic left kidney 2.  Left flank pain  Postoperative diagnosis: 1.  Atrophic left kidney 2.  Left flank pain   Procedure(s): 1.  Laparoscopic left simple nephrectomy   Surgeon: Rhoderick Moody, MD  Assistants:  Margette Fast, MD PGY4  Anesthesia:  General  Complications:  None  EBL:  50 mL  Specimens: 1. Left kidney  Drains/Catheters: 1.  Foley catheter  Intraoperative findings:   1. Atrophic left kidney  Indication:  Jackie Nelson is a 39 y.o. female, previously followed by Dr. 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 Recent CT showed a completely atrophic left kidney with little to no renal parenchyma. She had a LASIK renogram on 09/28/2019 that showed the left kidney was nonfunctional with a normally draining right kidney. She denies any prior abdominal surgeries. No prior history of kidney stones, dysuria or hematuria. She has been consented for the above procedures, voices understanding and wishes to proceed.  Description of procedure:  After informed consent was obtained, the patient was brought to the operating room and general anesthesia was administered. The patient was then placed in the right lateral decubitus position and prepped and draped in usual sterile fashion. A timeout was then performed. A 12 mm left lower quadrant incision was made. A Veress needle was then used to gain access to the abdominal cavity. A saline drop test within the Veress needle showed no signs of obstruction and no blood or sucus was seen upon aspiration of the needle. The abdomen was then insufflated up to 15 mmHg and a 12 mm laparoscopic port was placed within the abdominal cavity. A laparoscopic camera was then used to inspect the site of access, revealing no bowel or vessel injury. Additional 5 and 12 mm ports were placed under direct vision, achieving triangulation of the  left renal hilum.    The white line of Toldt of the descending colon was then incised using a combination of sharp and blunt dissection allowing the colon to be reflected medially, exposing the left retroperitoneal space. The splenic attachments were then incised using elctrocautery, giving adequate visualization and mobiliztion of the upper pole of the left kidney. I then created a retroperitoneal window caudad to the lower pole of the left kidney, exposing the anteromedial surface of the psoas muscle. The left ureter and gonadal vein were identified and reflected superiorly off of the psoas muscle.  We then dissected further cephalad using blunt dissection as well as hook electrocautery. The left renal vein and artery were then identified and completely isolated. The left renal artery was then ligated with the endovascular stapler, with good hemostasis. The left renal vein showed complete decompression and was ligated in a similar fashion. The left adrenal gland was then identified and preserved by dissecting it off the upper pole the left kidney, leaving its blood supply intact.   The left ureter and lower pole attachments of the left kidney were then ligated using the endovascular stapler.  The upper and lateral pole attachments of the left kidney were then incised using electrocautery, completely freeing it. The left kidney was then placed into an Endo Catch bag. Reinspection of the left renal hilum and left retroperitoneum revealed complete hemostasis.    A left lower quadrant Gibson incision was then made and the left kidney, still within the Endo Catch bag, was then removed from the abdominal cavity.  The [place] incision was then  closed using running 0 Vicryl to re-approximate the internal oblique fascia and 0 PDS suture to re-approximate the external oblique layers. The abdomen with the inflated and a second inspection of the left retroperitoneum revealed complete hemostasis.  The abdomen was then  desufflated and all laparoscopic ports were removed.  All skin incisions were then closed using 4-0 Monocryl and dressed with dermabond. The patient was then replaced in the supine position and awoken from anesthesia. The patient tolerated the procedure well and was transferred to the postanesthesia unit in stable condition.   Plan:  Monitor on the floor overnight.

## 2019-12-21 ENCOUNTER — Other Ambulatory Visit (HOSPITAL_COMMUNITY): Payer: Self-pay | Admitting: Urology

## 2019-12-21 ENCOUNTER — Encounter (HOSPITAL_COMMUNITY): Payer: Self-pay | Admitting: Urology

## 2019-12-21 LAB — SURGICAL PATHOLOGY

## 2019-12-21 LAB — HEMOGLOBIN AND HEMATOCRIT, BLOOD
HCT: 26.1 % — ABNORMAL LOW (ref 36.0–46.0)
HCT: 26.8 % — ABNORMAL LOW (ref 36.0–46.0)
Hemoglobin: 8.6 g/dL — ABNORMAL LOW (ref 12.0–15.0)
Hemoglobin: 8.6 g/dL — ABNORMAL LOW (ref 12.0–15.0)

## 2019-12-21 MED ORDER — SODIUM CHLORIDE 0.9 % IV SOLN: INTRAVENOUS | Status: DC

## 2019-12-21 MED ORDER — OXYCODONE HCL 5 MG PO TABS: 5.0000 mg | ORAL_TABLET | ORAL | 0 refills | Status: DC | PRN

## 2019-12-21 MED ORDER — ACETAMINOPHEN 500 MG PO TABS
1000.0000 mg | ORAL_TABLET | Freq: Four times a day (QID) | ORAL | Status: DC
  Administered 2019-12-21: 1000 mg via ORAL
  Filled 2019-12-21: qty 2

## 2019-12-21 MED FILL — oxyCODONE HCL 5 MG TABS: 5 | 3 days supply | Qty: 20 | Fill #0

## 2019-12-21 NOTE — Plan of Care (Signed)
  Problem: Education: Goal: Required Educational Video(s) Outcome: Adequate for Discharge   Problem: Clinical Measurements: Goal: Ability to maintain clinical measurements within normal limits will improve Outcome: Adequate for Discharge Goal: Postoperative complications will be avoided or minimized Outcome: Adequate for Discharge   Problem: Skin Integrity: Goal: Demonstration of wound healing without infection will improve Outcome: Adequate for Discharge   Problem: Education: Goal: Knowledge of General Education information will improve Description: Including pain rating scale, medication(s)/side effects and non-pharmacologic comfort measures Outcome: Adequate for Discharge   Problem: Health Behavior/Discharge Planning: Goal: Ability to manage health-related needs will improve Outcome: Adequate for Discharge   Problem: Clinical Measurements: Goal: Ability to maintain clinical measurements within normal limits will improve Outcome: Adequate for Discharge Goal: Will remain free from infection Outcome: Adequate for Discharge Goal: Diagnostic test results will improve Outcome: Adequate for Discharge Goal: Respiratory complications will improve Outcome: Adequate for Discharge Goal: Cardiovascular complication will be avoided Outcome: Adequate for Discharge   Problem: Activity: Goal: Risk for activity intolerance will decrease Outcome: Adequate for Discharge   Problem: Nutrition: Goal: Adequate nutrition will be maintained Outcome: Adequate for Discharge   Problem: Coping: Goal: Level of anxiety will decrease Outcome: Adequate for Discharge   Problem: Elimination: Goal: Will not experience complications related to bowel motility Outcome: Adequate for Discharge Goal: Will not experience complications related to urinary retention Outcome: Adequate for Discharge   Problem: Pain Managment: Goal: General experience of comfort will improve Outcome: Adequate for Discharge    Problem: Safety: Goal: Ability to remain free from injury will improve Outcome: Adequate for Discharge   Problem: Skin Integrity: Goal: Risk for impaired skin integrity will decrease Outcome: Adequate for Discharge   

## 2019-12-21 NOTE — Progress Notes (Signed)
1 Day Post-Op Subjective: No overnight events.  +out of bed.  No flatus.  Pain well controlled.  Objective: Vital signs in last 24 hours: Temp:  [97.7 F (36.5 C)-98.6 F (37 C)] 98.6 F (37 C) (10/12 0504) Pulse Rate:  [62-99] 99 (10/12 0504) Resp:  [11-19] 18 (10/12 0504) BP: (97-158)/(59-89) 145/86 (10/12 0504) SpO2:  [100 %] 100 % (10/12 0504) Weight:  [86.6 kg] 86.6 kg (10/11 1356)  Intake/Output from previous day: 10/11 0701 - 10/12 0700 In: 3219.8 [P.O.:120; I.V.:2999.8; IV Piggyback:100] Out: 750 [Urine:700; Blood:50] Intake/Output this shift: Total I/O In: 699.8 [I.V.:699.8] Out: 200 [Urine:200]  Physical Exam:  General: Alert and oriented CV: RRR Lungs: Clear Abdomen: Soft, ND, appropriately ttp LUQ Incisions: c/d/i Ext: NT, No erythema  Lab Results: Recent Labs    12/20/19 1010 12/21/19 0450  HGB 11.2* 8.6*  HCT 35.4* 26.1*   BMET Recent Labs    12/20/19 1010  NA 138  K 3.7  CL 106  CO2 23  GLUCOSE 164*  BUN 13  CREATININE 1.00  CALCIUM 8.5*     Studies/Results: No results found.  Assessment/Plan: 39 year old woman with a left nonfunctioning kidney postop day 1 status post a left laparoscopic assisted simple nephrectomy doing well.    -d/c Foley -ambulate in hall -decrease IVF -SCDs at all times while in bed -advance diet as tolerated -pain meds PRN -repeat pm Hgb   LOS: 1 day   Jackie Nelson D Jackie Nelson 12/21/2019, 6:54 AM

## 2019-12-21 NOTE — Discharge Summary (Signed)
Date of admission: 12/20/2019  Date of discharge: 12/21/2019  Admission diagnosis: Nonfunctioning left kidney  Discharge diagnosis: Nonfunctioning left kidney  Secondary diagnoses: None  History and Physical: For full details, please see admission history and physical. Briefly, Jackie Nelson is a 39 y.o. patient with nonfunctioning left kidney associated with left flank pain.   Hospital Course:  Patient underwent left laparoscopic radical nephrostomy with Dr. Liliane Shi on 12/20/19. She tolerated the procedure well and was transferred to the floor after receiving routine post-operative care. Her diet was gradually advanced, and her pain was controlled with analgesics.  Her foley catheter was removed on POD1, and she was able to void spontaneously. By later on POD1, she was ambulating without difficulty, tolerating solid foods without nausea/emesis, voiding spontaneously, and having pain controlled with PO analgesics. Thus, she was deemed appropriate for discharge home.   Laboratory values:  Recent Labs    12/20/19 1010 12/21/19 0450 12/21/19 1149  HGB 11.2* 8.6* 8.6*  HCT 35.4* 26.1* 26.8*   Recent Labs    12/20/19 1010  CREATININE 1.00    Disposition: Home  Discharge instruction: The patient was instructed to be ambulatory but told to refrain from heavy lifting, strenuous activity, or driving.   Discharge medications:  Allergies as of 12/21/2019   No Known Allergies     Medication List    TAKE these medications   medroxyPROGESTERone 150 MG/ML injection Commonly known as: DEPO-PROVERA Inject 1 mL (150 mg total) into the muscle every 3 (three) months.   oxyCODONE 5 MG immediate release tablet Commonly known as: Roxicodone Take 1 tablet (5 mg total) by mouth every 4 (four) hours as needed for severe pain.       Followup:  3-4 weeks with Dr. Liliane Shi for post-op check

## 2019-12-21 NOTE — Discharge Instructions (Signed)

## 2019-12-21 NOTE — Progress Notes (Signed)
Patient given discharge, medication, and follow up instructions, verbalized understanding, IV x 2 removed, personal belongings and prescription med with patient, family to transport home

## 2019-12-22 ENCOUNTER — Telehealth: Payer: Self-pay

## 2019-12-22 NOTE — Telephone Encounter (Signed)
Transition Care Management Follow-up Unsuccessful Telephone Call  Date of discharge and from where: [facility] on 12/21/2019  Called patient , unable to reach or have an option to leave a voice mail.

## 2019-12-23 ENCOUNTER — Telehealth: Payer: Self-pay

## 2019-12-23 NOTE — Telephone Encounter (Signed)
She has an appt for October and November. Which one does she want to keep?

## 2019-12-23 NOTE — Telephone Encounter (Signed)
Transition Care Management Follow-up Telephone Call Date of discharge and from where: 12/21/2019, [facility]   How have you been since you were released from the hospital? Better  Any questions or concerns? No questions/concerns reported.  Items Reviewed: Did the pt receive and understand the discharge instructions provided? Stated that have the instructions and have no questions.  Medications obtained and verified? She said that they have the medication list  and the hospital staff reviewed them in detail prior to discharge. She said that he has all of the medications and they have no questions.  Any new allergies since your discharge? None reported  Do you have support at home? Yes, husband Other (ie: DME, Home Health, etc)       Functional Questionnaire: (I = Independent and D = Dependent) ADL's:  Independent.      Follow up appointments reviewed:    PCP Hospital f/u appt confirmed? 10/27.2021@ 1050. Informed they would receive notification if the appointment is in person or televisit.    Specialist Hospital f/u appt confirmed? None scheduled at this time   Are transportation arrangements needed?have transportation    If their condition worsens, is the pt aware to call  their PCP or go to the ED? Yes.Made pt aware if condition worsen or start experiencing rapid weight gain, chest pain, diff breathing, SOB, high fevers, or bleading to refer imediately to ED for further evaluation.   Was the patient provided with contact information for the PCP's office or ED? He has the phone number   Was the pt encouraged to call back with questions or concerns?yes

## 2019-12-30 ENCOUNTER — Encounter: Payer: Self-pay | Admitting: *Deleted

## 2019-12-30 ENCOUNTER — Other Ambulatory Visit: Payer: Self-pay

## 2019-12-30 ENCOUNTER — Ambulatory Visit (INDEPENDENT_AMBULATORY_CARE_PROVIDER_SITE_OTHER): Payer: Medicaid Other | Admitting: *Deleted

## 2019-12-30 ENCOUNTER — Ambulatory Visit: Payer: Medicaid Other

## 2019-12-30 VITALS — BP 130/86 | HR 96 | Ht 66.0 in | Wt 190.0 lb

## 2019-12-30 DIAGNOSIS — Z3042 Encounter for surveillance of injectable contraceptive: Secondary | ICD-10-CM

## 2019-12-30 MED ORDER — MEDROXYPROGESTERONE ACETATE 150 MG/ML IM SUSP
150.0000 mg | Freq: Once | INTRAMUSCULAR | Status: AC
  Administered 2019-12-30: 150 mg via INTRAMUSCULAR

## 2019-12-30 NOTE — Telephone Encounter (Signed)
Spoke to patient. She would like to keep the 01/05/2020. CMA informed patient we will call her a day ahead to inform if the visit is virtual or In person. Pt. Understood. [place] interpreter assist w/ the call.

## 2019-12-30 NOTE — Progress Notes (Signed)
Video interpreter [place] [phone] used for encounter. Pt presents for Depo Provera injection. She reports having some light bleeding/spotting occasionally.  Depo Provera 150 mg IM administered and pt tolerated well. Next injection due 1/6-1/20/22. Per chart review, pt had recent surgery for Lt nephrectomy and states she is having a lot of pain despite taking Tylenol. I advised pt to contact her surgeon's office for pain management advice. Pt voiced understanding of all information and instructions given.

## 2019-12-31 NOTE — Progress Notes (Signed)
Chart reviewed for nurse visit. Agree with plan of care.   Marylene Land, CNM 12/31/2019 10:16 AM

## 2020-01-04 DIAGNOSIS — R1084 Generalized abdominal pain: Secondary | ICD-10-CM | POA: Diagnosis not present

## 2020-01-04 NOTE — Progress Notes (Signed)
Chart reviewed for nurse visit. Agree with plan of care.   Marylene Land, CNM 01/04/2020 4:33 PM

## 2020-01-05 ENCOUNTER — Other Ambulatory Visit: Payer: Self-pay

## 2020-01-05 ENCOUNTER — Ambulatory Visit: Payer: Medicaid Other | Attending: Nurse Practitioner | Admitting: Nurse Practitioner

## 2020-01-05 ENCOUNTER — Encounter: Payer: Self-pay | Admitting: Nurse Practitioner

## 2020-01-05 VITALS — Ht 66.0 in | Wt 190.0 lb

## 2020-01-05 DIAGNOSIS — O0889 Other complications following an ectopic and molar pregnancy: Secondary | ICD-10-CM | POA: Diagnosis not present

## 2020-01-05 DIAGNOSIS — Z7689 Persons encountering health services in other specified circumstances: Secondary | ICD-10-CM

## 2020-01-05 NOTE — Progress Notes (Signed)
Virtual Visit via Telephone Note Due to national recommendations of social distancing due to COVID 19, telehealth visit is felt to be most appropriate for this patient at this time.  I discussed the limitations, risks, security and privacy concerns of performing an evaluation and management service by telephone and the availability of in person appointments. I also discussed with the patient that there may be a patient responsible charge related to this service. The patient expressed understanding and agreed to proceed.    I connected with Jackie Nelson on 01/05/20  at  10:50 AM EDT  EDT by telephone and verified that I am speaking with the correct person using two identifiers.   Consent I discussed the limitations, risks, security and privacy concerns of performing an evaluation and management service by telephone and the availability of in person appointments. I also discussed with the patient that there may be a patient responsible charge related to this service. The patient expressed understanding and agreed to proceed.   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 She has already followed up with nephrology and been instructed to follow up as needed. She is cleared from a surgical standpoint per patient.   She has a history of partial molar pregnancy. S/P D&E (09-2019) She was supposed to return to the lab for repeat Beta HCG in August. However labs are still pending.   Denies chest pain, shortness of breath, palpitations, lightheadedness, dizziness, headaches or BLE edema.   Past Medical History:  Diagnosis Date  . Atrophic kidney   . Molar pregnancy 09/2019     Past Surgical History:  Procedure Laterality Date  . DILATION AND EVACUATION N/A 02/13/2018   Procedure: DILATATION AND EVACUATION;  Surgeon: Conan Bowens, MD;  Location: [facility] ORS;  Service: Gynecology;  Laterality: N/A;  . DILATION AND EVACUATION N/A 09/08/2019   Procedure: DILATATION AND EVACUATION;  Surgeon: Adam Phenix, MD;  Location: [facility] OR;  Service: Gynecology;  Laterality: N/A;  . LAPAROSCOPIC NEPHRECTOMY, HAND ASSISTED Left 12/20/2019   Procedure: LEFT LAPAROSCOPIC NEPHRECTOMY;  Surgeon: Rene Paci, MD;  Location: [facility] ORS;  Service: Urology;  Laterality: Left;    Family History  Problem Relation Age of Onset  . Diabetes Mother     Social History   Socioeconomic History  . Marital status: Married    Spouse name: Not on file  . Number of children: Not on file  . Years of education: Not on file  . Highest education level: Not on file  Occupational History  . Not on file  Tobacco Use  . Smoking status: Never Smoker  . Smokeless tobacco: Never Used  Vaping Use  . Vaping Use: Never used  Substance and Sexual Activity  . Alcohol use: Never  . Drug use: Never  . Sexual activity: Yes    Birth control/protection: None, Injection  Other Topics Concern  . Not on file  Social History Narrative  . Not on file   Social Determinants of Health   Financial Resource Strain:   . Difficulty of Paying Living Expenses: Not on file  Food Insecurity: Food Insecurity Present  . Worried About Programme researcher, broadcasting/film/video in the Last Year: Sometimes true  . Ran  Out of Food in the Last Year: Never true  Transportation Needs: No Transportation Needs  . Lack of Transportation (Medical): No  . Lack of Transportation (Non-Medical): No  Physical Activity:   . Days of Exercise per Week: Not on file  . Minutes of Exercise per Session: Not on file  Stress:   . Feeling of Stress : Not on file  Social Connections:   . Frequency of Communication with Friends and Family: Not on file  .  Frequency of Social Gatherings with Friends and Family: Not on file  . Attends Religious Services: Not on file  . Active Member of Clubs or Organizations: Not on file  . Attends Banker Meetings: Not on file  . Marital Status: Not on file     Observations/Objective: Awake, alert and oriented x 3   Review of Systems  Constitutional: Negative for fever, malaise/fatigue and weight loss.  HENT: Negative.  Negative for nosebleeds.   Eyes: Negative.  Negative for blurred vision, double vision and photophobia.  Respiratory: Negative.  Negative for cough and shortness of breath.   Cardiovascular: Negative.  Negative for chest pain, palpitations and leg swelling.  Gastrointestinal: Negative.  Negative for heartburn, nausea and vomiting.  Musculoskeletal: Negative.  Negative for myalgias.  Neurological: Negative.  Negative for dizziness, focal weakness, seizures and headaches.  Psychiatric/Behavioral: Negative.  Negative for suicidal ideas.    Assessment and Plan: Jackie Nelson was seen today for hospitalization follow-up.  Diagnoses and all orders for this visit:  Encounter to establish care  Partial molar pregnancy Follow up BETA HCG   Follow Up Instructions Return in about 4 weeks (around 02/02/2020).     I discussed the assessment and treatment plan with the patient. The patient was provided an opportunity to ask questions and all were answered. The patient agreed with the plan and demonstrated an understanding of the instructions.   The patient was advised to call back or seek an in-person evaluation if the symptoms worsen or if the condition fails to improve as anticipated.  I provided 17 minutes of non-face-to-face time during this encounter including median intraservice time, reviewing previous notes, labs, imaging, medications and explaining diagnosis and management.  Claiborne Rigg, FNP-BC

## 2020-01-06 DIAGNOSIS — R1084 Generalized abdominal pain: Secondary | ICD-10-CM | POA: Diagnosis not present

## 2020-01-10 ENCOUNTER — Encounter: Payer: Self-pay | Admitting: Nurse Practitioner

## 2020-01-14 ENCOUNTER — Other Ambulatory Visit (HOSPITAL_COMMUNITY): Payer: Self-pay | Admitting: *Deleted

## 2020-01-14 ENCOUNTER — Other Ambulatory Visit (HOSPITAL_COMMUNITY): Payer: Self-pay | Admitting: Adult Health

## 2020-01-14 DIAGNOSIS — R1084 Generalized abdominal pain: Secondary | ICD-10-CM

## 2020-01-19 ENCOUNTER — Other Ambulatory Visit: Payer: Self-pay | Admitting: Nurse Practitioner

## 2020-01-19 ENCOUNTER — Ambulatory Visit: Payer: Medicaid Other | Attending: Nurse Practitioner

## 2020-01-19 ENCOUNTER — Other Ambulatory Visit: Payer: Self-pay

## 2020-01-19 DIAGNOSIS — R7309 Other abnormal glucose: Secondary | ICD-10-CM | POA: Diagnosis not present

## 2020-01-19 DIAGNOSIS — O0889 Other complications following an ectopic and molar pregnancy: Secondary | ICD-10-CM

## 2020-01-19 DIAGNOSIS — D649 Anemia, unspecified: Secondary | ICD-10-CM

## 2020-01-19 DIAGNOSIS — N261 Atrophy of kidney (terminal): Secondary | ICD-10-CM | POA: Diagnosis not present

## 2020-01-19 DIAGNOSIS — Z13220 Encounter for screening for lipoid disorders: Secondary | ICD-10-CM

## 2020-01-20 LAB — CBC
Hematocrit: 27.9 % — ABNORMAL LOW (ref 34.0–46.6)
Hemoglobin: 8.5 g/dL — ABNORMAL LOW (ref 11.1–15.9)
MCH: 23.7 pg — ABNORMAL LOW (ref 26.6–33.0)
MCHC: 30.5 g/dL — ABNORMAL LOW (ref 31.5–35.7)
MCV: 78 fL — ABNORMAL LOW (ref 79–97)
Platelets: 554 10*3/uL — ABNORMAL HIGH (ref 150–450)
RBC: 3.59 x10E6/uL — ABNORMAL LOW (ref 3.77–5.28)
RDW: 16.2 % — ABNORMAL HIGH (ref 11.7–15.4)
WBC: 6 10*3/uL (ref 3.4–10.8)

## 2020-01-20 LAB — LIPID PANEL
Chol/HDL Ratio: 6 ratio — ABNORMAL HIGH (ref 0.0–4.4)
Cholesterol, Total: 169 mg/dL (ref 100–199)
HDL: 28 mg/dL — ABNORMAL LOW
LDL Chol Calc (NIH): 116 mg/dL — ABNORMAL HIGH (ref 0–99)
Triglycerides: 139 mg/dL (ref 0–149)
VLDL Cholesterol Cal: 25 mg/dL (ref 5–40)

## 2020-01-20 LAB — CMP14+EGFR
ALT: 17 IU/L (ref 0–32)
AST: 9 IU/L (ref 0–40)
Albumin/Globulin Ratio: 1 — ABNORMAL LOW (ref 1.2–2.2)
Albumin: 4.1 g/dL (ref 3.8–4.8)
Alkaline Phosphatase: 79 IU/L (ref 44–121)
BUN/Creatinine Ratio: 12 (ref 9–23)
BUN: 11 mg/dL (ref 6–20)
Bilirubin Total: 0.3 mg/dL (ref 0.0–1.2)
CO2: 23 mmol/L (ref 20–29)
Calcium: 9.8 mg/dL (ref 8.7–10.2)
Chloride: 100 mmol/L (ref 96–106)
Creatinine, Ser: 0.91 mg/dL (ref 0.57–1.00)
GFR calc Af Amer: 92 mL/min/{1.73_m2} (ref >59)
GFR calc non Af Amer: 80 mL/min/{1.73_m2} (ref >59)
Globulin, Total: 4.1 g/dL (ref 1.5–4.5)
Glucose: 85 mg/dL (ref 65–99)
Potassium: 4.4 mmol/L (ref 3.5–5.2)
Sodium: 140 mmol/L (ref 134–144)
Total Protein: 8.2 g/dL (ref 6.0–8.5)

## 2020-01-20 LAB — HEMOGLOBIN A1C
Est. average glucose Bld gHb Est-mCnc: 126 mg/dL
Hgb A1c MFr Bld: 6 % — ABNORMAL HIGH (ref 4.8–5.6)

## 2020-01-20 LAB — BETA HCG QUANT (REF LAB): hCG Quant: 0 m[IU]/mL

## 2020-01-26 ENCOUNTER — Other Ambulatory Visit (HOSPITAL_COMMUNITY): Payer: Medicaid Other

## 2020-01-31 ENCOUNTER — Other Ambulatory Visit: Payer: Self-pay | Admitting: Nurse Practitioner

## 2020-01-31 DIAGNOSIS — D649 Anemia, unspecified: Secondary | ICD-10-CM

## 2020-02-01 ENCOUNTER — Ambulatory Visit: Payer: Medicaid Other | Admitting: Nurse Practitioner

## 2020-02-02 DIAGNOSIS — N261 Atrophy of kidney (terminal): Secondary | ICD-10-CM | POA: Diagnosis not present

## 2020-02-16 ENCOUNTER — Encounter: Payer: Self-pay | Admitting: Nurse Practitioner

## 2020-02-16 ENCOUNTER — Ambulatory Visit: Payer: Medicaid Other | Attending: Nurse Practitioner | Admitting: Nurse Practitioner

## 2020-02-16 ENCOUNTER — Other Ambulatory Visit: Payer: Self-pay

## 2020-02-16 VITALS — BP 138/84 | HR 61 | Temp 97.5°F | Ht 65.0 in | Wt 178.0 lb

## 2020-02-16 DIAGNOSIS — Z0000 Encounter for general adult medical examination without abnormal findings: Secondary | ICD-10-CM | POA: Diagnosis not present

## 2020-02-16 DIAGNOSIS — R7303 Prediabetes: Secondary | ICD-10-CM

## 2020-02-16 DIAGNOSIS — Z23 Encounter for immunization: Secondary | ICD-10-CM

## 2020-02-16 LAB — GLUCOSE, POCT (MANUAL RESULT ENTRY): POC Glucose: 80 mg/dl (ref 70–99)

## 2020-02-16 MED ORDER — FLUTICASONE PROPIONATE 50 MCG/ACT NA SUSP: 1.0000 | Freq: Every day | NASAL | 2 refills | Status: DC

## 2020-02-16 NOTE — Progress Notes (Signed)
Assessment & Plan:  Jackie Nelson was seen today for annual exam.  Diagnoses and all orders for this visit:  Encounter for annual physical exam  Prediabetes -     Glucose (CBG)  Need for immunization against influenza -     Flu Vaccine QUAD 36+ mos IM  Other orders -     fluticasone (FLONASE) 50 MCG/ACT nasal spray; Place 1 spray into both nostrils daily.    Patient has been counseled on age-appropriate routine health concerns for screening and prevention. These are reviewed and up-to-date. Referrals have been placed accordingly. Immunizations are up-to-date or declined.    Subjective:   Chief Complaint  Patient presents with  . Annual Exam    Pt. is here for a physical.    HPI Jackie Nelson 39 y.o. female presents to office today for annual physical VRI was used to communicate directly with patient for the entire encounter including providing detailed patient instructions. She has a history of left atrophic kidney with left nephrectomy    Review of Systems  Constitutional: Negative for fever, malaise/fatigue and weight loss.  HENT: Negative.  Negative for nosebleeds.   Eyes: Negative.  Negative for blurred vision, double vision and photophobia.  Respiratory: Negative.  Negative for cough and shortness of breath.   Cardiovascular: Negative.  Negative for chest pain, palpitations and leg swelling.  Gastrointestinal: Negative.  Negative for heartburn, nausea and vomiting.  Genitourinary: Negative.   Musculoskeletal: Positive for back pain. Negative for myalgias.  Skin: Negative.   Neurological: Negative.  Negative for dizziness, focal weakness, seizures and headaches.  Endo/Heme/Allergies: Negative.   Psychiatric/Behavioral: Negative.  Negative for suicidal ideas.    Past Medical History:  Diagnosis Date  . Atrophic kidney   . Molar pregnancy 09/2019    Past Surgical History:  Procedure Laterality Date  . DILATION AND EVACUATION N/A 02/13/2018   Procedure: DILATATION  AND EVACUATION;  Surgeon: Conan Bowens, MD;  Location: [facility];  Service: Gynecology;  Laterality: N/A;  . DILATION AND EVACUATION N/A 09/08/2019   Procedure: DILATATION AND EVACUATION;  Surgeon: Adam Phenix, MD;  [facility];  Service: Gynecology;  Laterality: N/A;  . LAPAROSCOPIC NEPHRECTOMY, HAND ASSISTED Left 12/20/2019   Procedure: LEFT LAPAROSCOPIC NEPHRECTOMY;  Surgeon: Rene Paci, MD;  Location: [facility];  Service: Urology;  Laterality: Left;    Family History  Problem Relation Age of Onset  . Diabetes Mother     Social History Reviewed with no changes to be made today.   Outpatient Medications Prior to Visit  Medication Sig Dispense Refill  . acetaminophen (TYLENOL) 325 MG tablet Take 650 mg by mouth every 6 (six) hours as needed.    . medroxyPROGESTERone (DEPO-PROVERA) 150 MG/ML injection Inject 1 mL (150 mg total) into the muscle every 3 (three) months. 1 mL 0  . oxyCODONE (ROXICODONE) 5 MG immediate release tablet Take 1 tablet (5 mg total) by mouth every 4 (four) hours as needed for severe pain. (Patient not taking: Reported on 12/30/2019) 20 tablet 0   No facility-administered medications prior to visit.    No Known Allergies     Objective:    BP 138/84 (BP Location: Left Arm, Patient Position: Sitting, Cuff Size: Large)   Pulse 61   Temp (!) 97.5 F (36.4 C) (Temporal)   Ht 5\' 5"  (1.651 m)   Wt 178 lb (80.7 kg)   SpO2 100%   BMI 29.62 kg/m  Wt Readings from Last 3 Encounters:  02/16/20 178  lb (80.7 kg)  01/05/20 190 lb (86.2 kg)  12/30/19 190 lb (86.2 kg)    Physical Exam Vitals and nursing note reviewed.  Constitutional:      Appearance: She is well-developed.  HENT:     Head: Normocephalic and atraumatic.  Cardiovascular:     Rate and Rhythm: Normal rate and regular rhythm.     Heart sounds: Normal heart sounds. No murmur heard.  No friction rub. No gallop.   Pulmonary:     Effort: Pulmonary effort is normal. No tachypnea or  respiratory distress.     Breath sounds: Normal breath sounds. No decreased breath sounds, wheezing, rhonchi or rales.  Chest:     Chest wall: No tenderness.  Abdominal:     General: A surgical scar is present. Bowel sounds are normal. There is no distension.     Palpations: Abdomen is soft.     Tenderness: There is abdominal tenderness in the left lower quadrant. There is no right CVA tenderness or left CVA tenderness.     Hernia: No hernia is present.    Musculoskeletal:        General: Normal range of motion.     Cervical back: Normal range of motion.  Skin:    General: Skin is warm and dry.  Neurological:     Mental Status: She is alert and oriented to person, place, and time.     Coordination: Coordination normal.  Psychiatric:        Behavior: Behavior normal. Behavior is cooperative.        Thought Content: Thought content normal.        Judgment: Judgment normal.          Patient has been counseled extensively about nutrition and exercise as well as the importance of adherence with medications and regular follow-up. The patient was given clear instructions to go to ER or return to medical center if symptoms don't improve, worsen or new problems develop. The patient verbalized understanding.   Follow-up: Return in about 5 months (around 07/16/2020) for office visit A1c.   Claiborne Rigg, FNP-BC [facility] and [facility] [place], [place] [phone]   02/16/2020, 2:01 PM

## 2020-03-16 ENCOUNTER — Ambulatory Visit (INDEPENDENT_AMBULATORY_CARE_PROVIDER_SITE_OTHER): Payer: Medicaid Other | Admitting: General Practice

## 2020-03-16 ENCOUNTER — Other Ambulatory Visit: Payer: Self-pay

## 2020-03-16 VITALS — BP 131/79 | HR 75 | Ht 64.0 in | Wt 181.0 lb

## 2020-03-16 DIAGNOSIS — Z3042 Encounter for surveillance of injectable contraceptive: Secondary | ICD-10-CM | POA: Diagnosis not present

## 2020-03-16 MED ORDER — MEDROXYPROGESTERONE ACETATE 150 MG/ML IM SUSP
150.0000 mg | Freq: Once | INTRAMUSCULAR | Status: AC
  Administered 2020-03-16: 150 mg via INTRAMUSCULAR

## 2020-03-16 NOTE — Progress Notes (Signed)
[name] here for Depo-Provera Injection. Injection administered without complication. Patient will return in 3 months for next injection between March 24 and April 7. Next annual visit due August 2022.   Marylynn Pearson, RN 03/16/2020  9:25 AM

## 2020-03-20 NOTE — Progress Notes (Signed)
Chart reviewed for nurse visit. Agree with plan of care.   Marylene Land, CNM 03/20/2020 11:59 AM

## 2020-04-12 ENCOUNTER — Ambulatory Visit: Payer: Medicaid Other | Admitting: Obstetrics & Gynecology

## 2020-04-12 ENCOUNTER — Ambulatory Visit: Payer: Medicaid Other | Admitting: Family Medicine

## 2020-05-04 DIAGNOSIS — R1032 Left lower quadrant pain: Secondary | ICD-10-CM | POA: Diagnosis not present

## 2020-05-04 DIAGNOSIS — N261 Atrophy of kidney (terminal): Secondary | ICD-10-CM | POA: Diagnosis not present

## 2020-05-22 ENCOUNTER — Other Ambulatory Visit: Payer: Self-pay

## 2020-05-22 ENCOUNTER — Ambulatory Visit: Payer: Medicaid Other | Attending: Urology | Admitting: Physical Therapy

## 2020-05-22 ENCOUNTER — Encounter: Payer: Self-pay | Admitting: Physical Therapy

## 2020-05-22 DIAGNOSIS — M6281 Muscle weakness (generalized): Secondary | ICD-10-CM | POA: Diagnosis not present

## 2020-05-22 DIAGNOSIS — R293 Abnormal posture: Secondary | ICD-10-CM | POA: Insufficient documentation

## 2020-05-22 DIAGNOSIS — R252 Cramp and spasm: Secondary | ICD-10-CM | POA: Insufficient documentation

## 2020-05-22 NOTE — Therapy (Signed)
[facility] [address] [place], [place], [address] Phone: [phone]   Fax:  [phone]  Physical Therapy Evaluation  Patient Details  Name: Jackie Nelson MRN: [id number] Date of Birth: 1980-07-23 Referring Provider (PT): Crista Elliot, MD   Encounter Date: 05/22/2020   PT End of Session - 05/22/20 1233    Visit Number 1    Date for PT Re-Evaluation 07/17/20    Authorization Type healthy blue medicaid    PT Start Time 1147    PT Stop Time 1223    PT Time Calculation (min) 36 min    Activity Tolerance Patient tolerated treatment well    Behavior During Therapy [facility] for tasks assessed/performed           Past Medical History:  Diagnosis Date  . Atrophic kidney   . Molar pregnancy 09/2019    Past Surgical History:  Procedure Laterality Date  . DILATION AND EVACUATION N/A 02/13/2018   Procedure: DILATATION AND EVACUATION;  Surgeon: Conan Bowens, MD;  Location: [facility];  Service: Gynecology;  Laterality: N/A;  . DILATION AND EVACUATION N/A 09/08/2019   Procedure: DILATATION AND EVACUATION;  Surgeon: Adam Phenix, MD;  Location: [facility] OR;  Service: Gynecology;  Laterality: N/A;  . LAPAROSCOPIC NEPHRECTOMY, HAND ASSISTED Left 12/20/2019   Procedure: LEFT LAPAROSCOPIC NEPHRECTOMY;  Surgeon: Rene Paci, MD;  Location: [facility];  Service: Urology;  Laterality: Left;    There were no vitals filed for this visit.    Subjective Assessment - 05/22/20 1150    Subjective Pt states the pain has been there ever since the surgery up to 5/10 and when coughing or stretching.  Pt denies pain or straining with BMs.  Pt states no bladder issue including leakage, urge, frequency.  Pain when pressing on the left abdomen where they did surgery.    Patient is accompained by: Interpreter    Patient Stated Goals reduce pain with pressure and be able to cough or stretch without pain    Currently in Pain? No/denies               [facility] PT Assessment - 05/22/20 0001      Assessment   Medical Diagnosis R10.32 (ICD-10-CM) - Left lower quadrant pain; N26.1 (ICD-10-CM) - Atrophy of kidney (terminal)    Referring Provider (PT) Crista Elliot, MD    Prior Therapy No      Precautions   Precautions None      Restrictions   Weight Bearing Restrictions No      Balance Screen   Has the patient fallen in the past 6 months No      Home Environment   Living Environment Private residence    Living Arrangements Spouse/significant other   4 children     Prior Function   Level of Independence Independent    Vocation Unemployed    Leisure exercies but not lately because of pain      Cognition   Overall Cognitive Status Within Functional Limits for tasks assessed      Functional Tests   Functional tests Squat;Single leg stance      Squat   Comments leaning forward only gets to quarter squat, onto forefoot and knees over toes      Single Leg Stance   Comments trendelenburg Lt LE      Posture/Postural Control   Posture/Postural Control Postural limitations    Postural Limitations Rounded Shoulders;Increased lumbar lordosis;Anterior pelvic tilt;Weight  shift right    Posture Comments side bend to the Lt      ROM / Strength   AROM / PROM / Strength AROM;PROM;Strength      AROM   Overall AROM Comments lumbar flex to knees +pain Lt side; side bend to Rt +pain Lt side      PROM   Overall PROM Comments Lt hip flex, ER/IR 50% + pain in abdomen      Strength   Overall Strength Comments Lt hip abduction and adduction 4/5; Rt abduction 4+/5      Flexibility   Soft Tissue Assessment /Muscle Length yes    Hamstrings Lt 70% ; Rt 80%      Palpation   Palpation comment glutes, lumbar, hamstrings tight; diaphragm restricted, very TTP abdomen Lt side especially just left of umbilicus      Ambulation/Gait   Gait Pattern Decreased stride length;Decreased stance time - left                       Objective measurements completed on examination: See above findings.     Pelvic Floor Special Questions - 05/22/20 0001    Prior Pregnancies No    Urinary Leakage No    Urinary urgency No    Urinary frequency normal    Fecal incontinence No    Falling out feeling (prolapse) No    Exam Type Deferred                      PT Short Term Goals - 05/22/20 1542      PT SHORT TERM GOAL #1   Title pt will be ind with initial HEP             PT Long Term Goals - 05/22/20 1536      PT LONG TERM GOAL #1   Title Pt will report <3/10 abdominal pain due to improved pain management    Baseline 5/10    Time 12    Period Weeks    Status New    Target Date 08/14/20      PT LONG TERM GOAL #2   Title Pt will be ind with advanced HEP for maintaining improved function    Baseline does not know    Time 12    Period Weeks    Status New    Target Date 08/14/20      PT LONG TERM GOAL #3   Title Pt will demonstrate single leg stand without trendelenburge for 10 seconds bilaterally due to improved core and hip strength    Baseline 3 sec trendelenburg    Time 12    Period Weeks    Status New    Target Date 08/14/20      PT LONG TERM GOAL #4   Title Pt will be able to perform squat with correct technique due to improved core and hip strength    Time 12    Period Weeks    Status New    Target Date 08/14/20                  Plan - 05/22/20 1233    Clinical Impression Statement Pt to clinic today due to s/p nephrectomy on Lt side with pain in left abdomen since surgery.  Pt sits and stands with side bending to the Lt and unable to straighen up completely.  Pt performs Lt single leg stand with trendelenburg.  She has reduced core  and glute strength noted during squat with decreased hip hinge and forward weight shift.  Pt's Lt abduction and adduction 4-/5; and pain with Rt LE MMT 4+/5 Rt abduction.  She has fascial restrictions and tension in  diaphragm and Lt hip flexors.  Very TTP Lt abdomen especially lateral to umbilicus.  tight lumbar gluteals and hamstrings.  Pt has decreased hip ROM as noted above.  Pt will benefit from skilled PT to address impairments and return to maximum functional activities.    Personal Factors and Comorbidities Comorbidity 1    Comorbidities s/p nephrectomy    Examination-Activity Limitations Reach Overhead;Stand;Sit   coughing   Examination-Participation Restrictions Community Activity    Stability/Clinical Decision Making Stable/Uncomplicated    Clinical Decision Making Low    Rehab Potential Excellent    PT Frequency 1x / week    PT Duration 12 weeks    PT Treatment/Interventions ADLs/Self Care Home Management;Biofeedback;Cryotherapy;Electrical Stimulation;Moist Heat;Therapeutic activities;Therapeutic exercise;Neuromuscular re-education;Patient/family education;Manual techniques;Passive range of motion;Dry needling;Taping    PT Next Visit Plan abdominal fascial release, lumbar, thoracic mobility, hip and h/s stretch, hip flexor stretches; basic core strength    Consulted and Agree with Plan of Care Patient;Other (Comment)   interpreter          Patient will benefit from skilled therapeutic intervention in order to improve the following deficits and impairments:  Pain,Postural dysfunction,Impaired flexibility,Increased fascial restricitons,Decreased strength,Decreased range of motion,Increased muscle spasms  Visit Diagnosis: Cramp and spasm  Muscle weakness (generalized)  Abnormal posture     Problem List Patient Active Problem List   Diagnosis Date Noted  . Nonfunctioning kidney 12/20/2019  . 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 Language barrier 10/26/2019  . Partial molar pregnancy 10/08/2019  . Missed abortion 09/06/2019  . Abnormal ultrasound of left kidney 09/06/2019    Junious Silk, PT 05/22/2020, 4:10 PM  [facility] [address] [place], [place], [address] Phone: [phone]   Fax:  [phone]  Name: Jackie Nelson MRN: [id number] Date of Birth: Sep 19, 1980

## 2020-06-01 ENCOUNTER — Ambulatory Visit (INDEPENDENT_AMBULATORY_CARE_PROVIDER_SITE_OTHER): Payer: Medicaid Other

## 2020-06-01 ENCOUNTER — Other Ambulatory Visit: Payer: Self-pay

## 2020-06-01 VITALS — BP 141/90 | HR 82 | Wt 191.4 lb

## 2020-06-01 DIAGNOSIS — Z3042 Encounter for surveillance of injectable contraceptive: Secondary | ICD-10-CM | POA: Diagnosis not present

## 2020-06-01 MED ORDER — MEDROXYPROGESTERONE ACETATE 150 MG/ML IM SUSP
150.0000 mg | Freq: Once | INTRAMUSCULAR | Status: AC
  Administered 2020-06-01: 150 mg via INTRAMUSCULAR

## 2020-06-01 NOTE — Progress Notes (Signed)
.  [name] here for Depo-Provera Injection. Injection administered without complication. Patient will return in 3 months for next injection between June 9 and June 23,2022. Next annual visit due in Dec 2022. Pt aware.   Pt states wanting to possibly change birth control method with next Depo injection. Pt will make appt today.   Isabell Jarvis, RN 06/01/2020  8:23 AM

## 2020-06-08 ENCOUNTER — Other Ambulatory Visit: Payer: Self-pay

## 2020-06-08 ENCOUNTER — Encounter: Payer: Self-pay | Admitting: Physical Therapy

## 2020-06-08 ENCOUNTER — Ambulatory Visit: Payer: Medicaid Other | Admitting: Physical Therapy

## 2020-06-08 DIAGNOSIS — R252 Cramp and spasm: Secondary | ICD-10-CM | POA: Diagnosis not present

## 2020-06-08 DIAGNOSIS — R293 Abnormal posture: Secondary | ICD-10-CM | POA: Diagnosis not present

## 2020-06-08 DIAGNOSIS — M6281 Muscle weakness (generalized): Secondary | ICD-10-CM | POA: Diagnosis not present

## 2020-06-08 NOTE — Patient Instructions (Signed)
Access Code: [email or web address] URL: [email or web address] Date: 06/08/2020 Prepared by: Dwana Curd  Exercises Supine Lower Trunk Rotation - 1 x daily - 7 x weekly - 1 sets - 10 reps - 10 sec hold Sidelying Thoracic Rotation with Open Book - 1 x daily - 7 x weekly - 5 reps - 1 sets - 10 sec hold Supine Butterfly Groin Stretch - 1 x daily - 7 x weekly - 3 reps - 1 sets - 30 sec hold Supine Figure 4 Piriformis Stretch - 1 x daily - 7 x weekly - 3 reps - 1 sets - 30 sec hold Sidelying Bent Knee Hip Flexion - 1 x daily - 7 x weekly - 3 sets - 10 reps Standing Hamstring Stretch with Step - 1 x daily - 7 x weekly - 3 reps - 1 sets - 30 sec hold

## 2020-06-08 NOTE — Therapy (Signed)
[facility] [address] [place], [place], [address] Phone: [phone]   Fax:  [phone]  Physical Therapy Treatment  Patient Details  Name: Jackie Nelson MRN: [id number] Date of Birth: [birth year] Referring Provider (PT): Lucas Mallow, MD   Encounter Date: 06/08/2020   PT End of Session - 06/08/20 0935    Visit Number 2    Date for PT Re-Evaluation 07/17/20    Authorization Type healthy blue medicaid    PT Start Time 0804    PT Stop Time 0845    PT Time Calculation (min) 41 min    Activity Tolerance Patient tolerated treatment well    Behavior During Therapy [facility] for tasks assessed/performed           Past Medical History:  Diagnosis Date  . Atrophic kidney   . Molar pregnancy 09/2019    Past Surgical History:  Procedure Laterality Date  . DILATION AND EVACUATION N/A 02/13/2018   Procedure: DILATATION AND EVACUATION;  Surgeon: Sloan Leiter, MD;  Location: [facility];  Service: Gynecology;  Laterality: N/A;  . DILATION AND EVACUATION N/A 09/08/2019   Procedure: DILATATION AND EVACUATION;  Surgeon: Woodroe Mode, MD;  Location: [place];  Service: Gynecology;  Laterality: N/A;  . LAPAROSCOPIC NEPHRECTOMY, HAND ASSISTED Left 12/20/2019   Procedure: LEFT LAPAROSCOPIC NEPHRECTOMY;  Surgeon: Ceasar Mons, MD;  Location: [facility];  Service: Urology;  Laterality: Left;    There were no vitals filed for this visit.   Subjective Assessment - 06/08/20 0806    Subjective Pain in the stomach and back today but it has gotten a little better.    Patient is accompained by: Interpreter   [phone]   Currently in Pain? Yes    Pain Score 5     Pain Location Abdomen    Pain Orientation Left    Pain Type Acute pain    Pain Radiating Towards around the back    Pain Frequency Intermittent    Aggravating Factors  pressing, try to lift heavy    Pain Relieving Factors sitting and not touching it    Multiple Pain  Sites No                             [facility] Adult PT Treatment/Exercise - 06/08/20 0001      Exercises   Exercises Lumbar      Lumbar Exercises: Stretches   Active Hamstring Stretch Right;Left;20 seconds    Single Knee to Chest Stretch Right;Left;10 seconds    Double Knee to Chest Stretch 1 rep;10 seconds    Lower Trunk Rotation 2 reps;10 seconds    Figure 4 Stretch 2 reps;10 seconds    Other Lumbar Stretch Exercise lumbar and thoracic rotatoin      Manual Therapy   Manual Therapy Myofascial release;Soft tissue mobilization    Soft tissue mobilization lumbar paraspinals in prone    Myofascial Release abdominal release Lt>Rt                  PT Education - 06/08/20 0848    Education Details Access Code: [email or web address]    Person(s) Educated Patient    Methods Demonstration;Explanation;Tactile cues;Verbal cues;Handout    Comprehension Verbalized understanding;Returned demonstration            PT Short Term Goals - 05/22/20 1542      PT SHORT TERM GOAL #1   Title pt will be  ind with initial HEP             PT Long Term Goals - 05/22/20 1536      PT LONG TERM GOAL #1   Title Pt will report <3/10 abdominal pain due to improved pain management    Baseline 5/10    Time 12    Period Weeks    Status New    Target Date 08/14/20      PT LONG TERM GOAL #2   Title Pt will be ind with advanced HEP for maintaining improved function    Baseline does not know    Time 12    Period Weeks    Status New    Target Date 08/14/20      PT LONG TERM GOAL #3   Title Pt will demonstrate single leg stand without trendelenburge for 10 seconds bilaterally due to improved core and hip strength    Baseline 3 sec trendelenburg    Time 12    Period Weeks    Status New    Target Date 08/14/20      PT LONG TERM GOAL #4   Title Pt will be able to perform squat with correct technique due to improved core and hip strength    Time 12    Period Weeks    Status New     Target Date 08/14/20                 Plan - 06/08/20 0848    Clinical Impression Statement Pt was very tight along lumbar paraspinals.  STM helped to release and pt was given stretches to improve and maintain increased movement achieved during today's session.  Pt will benefir from skilled therapy, no goals met at this time as it was intitial treatment    PT Treatment/Interventions ADLs/Self Care Home Management;Biofeedback;Cryotherapy;Electrical Stimulation;Moist Heat;Therapeutic activities;Therapeutic exercise;Neuromuscular re-education;Patient/family education;Manual techniques;Passive range of motion;Dry needling;Taping    PT Next Visit Plan f/u on stretches and pelvic tilts and core stretches    Consulted and Agree with Plan of Care Patient;Other (Comment)           Patient will benefit from skilled therapeutic intervention in order to improve the following deficits and impairments:  Pain,Postural dysfunction,Impaired flexibility,Increased fascial restricitons,Decreased strength,Decreased range of motion,Increased muscle spasms  Visit Diagnosis: Cramp and spasm  Muscle weakness (generalized)  Abnormal posture     Problem List Patient Active Problem List   Diagnosis Date Noted  . Nonfunctioning kidney 12/20/2019  . 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 Language barrier 10/26/2019  . Partial molar pregnancy 10/08/2019  . Missed abortion 09/06/2019  . Abnormal ultrasound of left kidney 09/06/2019    Jule Ser, PT 06/08/2020, 9:41 AM  [facility] [address], [place], [place], [address] Phone: [phone]   Fax:  [phone]  Name: Jackie Nelson MRN: [id number] Date of Birth: 1980/05/19

## 2020-06-15 ENCOUNTER — Other Ambulatory Visit: Payer: Self-pay

## 2020-06-15 ENCOUNTER — Ambulatory Visit: Payer: Medicaid Other | Attending: Urology | Admitting: Physical Therapy

## 2020-06-15 ENCOUNTER — Encounter: Payer: Self-pay | Admitting: Physical Therapy

## 2020-06-15 DIAGNOSIS — R293 Abnormal posture: Secondary | ICD-10-CM | POA: Diagnosis not present

## 2020-06-15 DIAGNOSIS — R252 Cramp and spasm: Secondary | ICD-10-CM | POA: Diagnosis not present

## 2020-06-15 DIAGNOSIS — M6281 Muscle weakness (generalized): Secondary | ICD-10-CM | POA: Insufficient documentation

## 2020-06-15 NOTE — Therapy (Signed)
[facility] [address], [address] [place], [place], [address] Phone: [phone]   Fax:  [phone]  Physical Therapy Treatment  Patient Details  Name: Jackie Nelson MRN: [id number] Date of Birth: May 14, 1980 Referring Provider (PT): Crista Elliot, MD   Encounter Date: 06/15/2020   PT End of Session - 06/15/20 0751    Visit Number 3    Date for PT Re-Evaluation 07/17/20    Authorization Type healthy blue medicaid    PT Start Time 0800    PT Stop Time 0838    PT Time Calculation (min) 38 min    Activity Tolerance Patient tolerated treatment well    Behavior During Therapy [facility] for tasks assessed/performed           Past Medical History:  Diagnosis Date  . Atrophic kidney   . Molar pregnancy 09/2019    Past Surgical History:  Procedure Laterality Date  . DILATION AND EVACUATION N/A 02/13/2018   Procedure: DILATATION AND EVACUATION;  Surgeon: Conan Bowens, MD;  Location: [facility];  Service: Gynecology;  Laterality: N/A;  . DILATION AND EVACUATION N/A 09/08/2019   Procedure: DILATATION AND EVACUATION;  Surgeon: Adam Phenix, MD;  Location: [facility] OR;  Service: Gynecology;  Laterality: N/A;  . LAPAROSCOPIC NEPHRECTOMY, HAND ASSISTED Left 12/20/2019   Procedure: LEFT LAPAROSCOPIC NEPHRECTOMY;  Surgeon: Rene Paci, MD;  Location: [facility];  Service: Urology;  Laterality: Left;    There were no vitals filed for this visit.   Subjective Assessment - 06/15/20 0803    Subjective Pt states she is have pain in the back on the left side    Patient is accompained by: Interpreter    Patient Stated Goals reduce pain with pressure and be able to cough or stretch without pain    Currently in Pain? Yes    Pain Score 5     Pain Location Back    Pain Orientation Left    Pain Descriptors / Indicators Aching    Pain Type Acute pain    Pain Onset More than a month ago    Pain Frequency Intermittent    Multiple Pain  Sites No                             [facility] Adult PT Treatment/Exercise - 06/15/20 0001      Lumbar Exercises: Stretches   Other Lumbar Stretch Exercise lumbar and thoracic rotatoin    Other Lumbar Stretch Exercise lumbar thoracic ext at the wall      Lumbar Exercises: Standing   Functional Squats 10 reps      Lumbar Exercises: Supine   Ab Set 10 reps   core sets   Pelvic Tilt 10 reps    Pelvic Tilt Limitations a lot of heavy tactile cues needed    Bent Knee Raise 10 reps    Other Supine Lumbar Exercises bent knee fall out      Lumbar Exercises: Sidelying   Clam 20 reps      Lumbar Exercises: Quadruped   [place] 5 reps    Single Arm Raise 10 reps    Other Quadruped Lumbar Exercises core sets - 5 x      Manual Therapy   Manual Therapy Joint mobilization    Joint Mobilization thoracic rotation in Rt sidelying - 3 bouts x 20 sec  PT Education - 06/15/20 0839    Education Details Access Code: [email or web address]    Person(s) Educated Patient    Methods Explanation;Demonstration;Tactile cues;Verbal cues;Handout    Comprehension Verbalized understanding            PT Short Term Goals - 06/15/20 0805      PT SHORT TERM GOAL #1   Title pt will be ind with initial HEP    Status Achieved             PT Long Term Goals - 06/15/20 0805      PT LONG TERM GOAL #1   Title Pt will report <3/10 abdominal pain due to improved pain management    Status On-going      PT LONG TERM GOAL #2   Title Pt will be ind with advanced HEP for maintaining improved function    Status On-going      PT LONG TERM GOAL #3   Title Pt will demonstrate single leg stand without trendelenburge for 10 seconds bilaterally due to improved core and hip strength    Status On-going      PT LONG TERM GOAL #4   Title Pt will be able to perform squat with correct technique due to improved core and hip strength    Baseline rotates slightly to the left and  collapses into the left lower abdomen when squatting, improved with cues    Status On-going                 Plan - 06/15/20 8413    Clinical Impression Statement Pt is doing well with stretches at home.  Overall, she is having less pain with pressure on the abdomen.  Pt still having left flank pain and more in the back today.  Pt was able to begin basic core strengthening.  Pt has tight thoracic multifidi around T12-10 and decreased lumbar/thoracic extension.  Pt given updates to HEP and will benefit from skilled PT to continue to address core weakness and improve functional squat and lifting as she needs cues to perform correctly and core and hip weakness limiting.    PT Treatment/Interventions ADLs/Self Care Home Management;Biofeedback;Cryotherapy;Electrical Stimulation;Moist Heat;Therapeutic activities;Therapeutic exercise;Neuromuscular re-education;Patient/family education;Manual techniques;Passive range of motion;Dry needling;Taping    PT Next Visit Plan glute med and core strength; modified plank on table; back ext, sitting posture with support           Patient will benefit from skilled therapeutic intervention in order to improve the following deficits and impairments:  Pain,Postural dysfunction,Impaired flexibility,Increased fascial restricitons,Decreased strength,Decreased range of motion,Increased muscle spasms  Visit Diagnosis: Cramp and spasm  Muscle weakness (generalized)  Abnormal posture     Problem List Patient Active Problem List   Diagnosis Date Noted  . Nonfunctioning kidney 12/20/2019  . 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 Language barrier 10/26/2019  . Partial molar pregnancy 10/08/2019  . Missed abortion 09/06/2019  . Abnormal ultrasound of left kidney 09/06/2019    Junious Silk , PT 06/15/2020, 8:45 AM  [facility] [address] [place], [place], [address] Phone: [phone]   Fax:   [phone]  Name: Jackie Nelson MRN: [id number] Date of Birth: 28-Nov-1980

## 2020-06-15 NOTE — Patient Instructions (Signed)
Access Code: [email or web address] URL: [email or web address] Date: 06/15/2020 Prepared by: Dwana Curd  Exercises Supine Lower Trunk Rotation - 1 x daily - 7 x weekly - 1 sets - 10 reps - 10 sec hold Sidelying Thoracic Rotation with Open Book - 1 x daily - 7 x weekly - 5 reps - 1 sets - 10 sec hold Supine Butterfly Groin Stretch - 1 x daily - 7 x weekly - 3 reps - 1 sets - 30 sec hold Supine Figure 4 Piriformis Stretch - 1 x daily - 7 x weekly - 3 reps - 1 sets - 30 sec hold Sidelying Bent Knee Hip Flexion - 1 x daily - 7 x weekly - 3 sets - 10 reps Standing Hamstring Stretch with Step - 1 x daily - 7 x weekly - 3 reps - 1 sets - 30 sec hold Bent Knee Fallouts - 1 x daily - 7 x weekly - 3 sets - 10 reps Clamshell - 1 x daily - 7 x weekly - 3 sets - 10 reps Standing Lumbar Extension at Wall - Forearms - 3 x daily - 7 x weekly - 1 sets - 10 reps - 5 hold

## 2020-06-29 ENCOUNTER — Other Ambulatory Visit: Payer: Self-pay

## 2020-06-29 ENCOUNTER — Encounter: Payer: Self-pay | Admitting: Physical Therapy

## 2020-06-29 ENCOUNTER — Ambulatory Visit: Payer: Medicaid Other | Admitting: Physical Therapy

## 2020-06-29 DIAGNOSIS — R293 Abnormal posture: Secondary | ICD-10-CM

## 2020-06-29 DIAGNOSIS — M6281 Muscle weakness (generalized): Secondary | ICD-10-CM

## 2020-06-29 DIAGNOSIS — R252 Cramp and spasm: Secondary | ICD-10-CM | POA: Diagnosis not present

## 2020-06-29 NOTE — Therapy (Signed)
[facility] [address], [address] [place], [place], [address] Phone: [phone]   Fax:  [phone]  Physical Therapy Treatment  Patient Details  Name: Jackie Nelson MRN: [id number] Date of Birth: Nov 07, 1980 Referring Provider (PT): Crista Elliot, MD   Encounter Date: 06/29/2020   PT End of Session - 06/29/20 1236    Visit Number 4    Date for PT Re-Evaluation 07/17/20    Authorization Type healthy blue medicaid    PT Start Time 1102    PT Stop Time 1152    PT Time Calculation (min) 50 min    Activity Tolerance Patient tolerated treatment well    Behavior During Therapy [facility] for tasks assessed/performed           Past Medical History:  Diagnosis Date  . Atrophic kidney   . Molar pregnancy 09/2019    Past Surgical History:  Procedure Laterality Date  . DILATION AND EVACUATION N/A 02/13/2018   Procedure: DILATATION AND EVACUATION;  Surgeon: Conan Bowens, MD;  Location: [facility];  Service: Gynecology;  Laterality: N/A;  . DILATION AND EVACUATION N/A 09/08/2019   Procedure: DILATATION AND EVACUATION;  Surgeon: Adam Phenix, MD;  Location: [facility] OR;  Service: Gynecology;  Laterality: N/A;  . LAPAROSCOPIC NEPHRECTOMY, HAND ASSISTED Left 12/20/2019   Procedure: LEFT LAPAROSCOPIC NEPHRECTOMY;  Surgeon: Rene Paci, MD;  Location: [facility];  Service: Urology;  Laterality: Left;    There were no vitals filed for this visit.   Subjective Assessment - 06/29/20 1107    Subjective Pt states the pain is starting to go away totally and I know when I have gas.    Patient Stated Goals reduce pain with pressure and be able to cough or stretch without pain    Currently in Pain? No/denies                             [facility] Adult PT Treatment/Exercise - 06/29/20 0001      Self-Care   Self-Care Other Self-Care Comments    Other Self-Care Comments  educated posture in sitting and stand and to  stretch more throughout the day for improved recovery time      Lumbar Exercises: Stretches   Hip Flexor Stretch Right;Left;2 reps;20 seconds    Hip Flexor Stretch Limitations half kneel    Other Lumbar Stretch Exercise thoracic rotation in sidelying      Lumbar Exercises: Standing   Functional Squats 10 reps    Functional Squats Limitations cues to sit into left LE more and relax shoulds    Other Standing Lumbar Exercises single leg stand - 10 sec x 2    Other Standing Lumbar Exercises shoulder ext and pallof press - 20x      Manual Therapy   Soft tissue mobilization lumbar and thoracic paraspinals in prone    Myofascial Release lumbar/thoracic  release            Trigger Point Dry Needling - 06/29/20 0001    Consent Given? Yes    Education Handout Provided Yes    Muscles Treated Back/Hip Thoracic multifidi;Lumbar multifidi    Lumbar multifidi Response Twitch response elicited;Palpable increased muscle length    Thoracic multifidi response Twitch response elicited;Palpable increased muscle length                PT Education - 06/29/20 1234    Education Details Access Code: [email or web address]  Person(s) Educated Patient    Methods Explanation;Demonstration;Tactile cues;Verbal cues;Handout    Comprehension Verbalized understanding;Returned demonstration            PT Short Term Goals - 06/15/20 0805      PT SHORT TERM GOAL #1   Title pt will be ind with initial HEP    Status Achieved             PT Long Term Goals - 06/15/20 0805      PT LONG TERM GOAL #1   Title Pt will report <3/10 abdominal pain due to improved pain management    Status On-going      PT LONG TERM GOAL #2   Title Pt will be ind with advanced HEP for maintaining improved function    Status On-going      PT LONG TERM GOAL #3   Title Pt will demonstrate single leg stand without trendelenburge for 10 seconds bilaterally due to improved core and hip strength    Status On-going      PT LONG  TERM GOAL #4   Title Pt will be able to perform squat with correct technique due to improved core and hip strength    Baseline rotates slightly to the left and collapses into the left lower abdomen when squatting, improved with cues    Status On-going                 Plan - 06/29/20 1157    Clinical Impression Statement Pt is feeling better overall and states only pain after exercises when she walks around her house . She is walking at a fast pace when she does this. Pt continues to have a lot of tension throughout the left side and Lt hip weakness with instability in half knee on left and Lt single leg stand with trendelenburg still present.  Pt was educated on dry needling and did DN with treatment today.  She had increased muscle length and did rotation stretches.  Educated on core strength and hip flexor stretch today and added to HEP.  Pt will benefit from skilled PT to continue to progress strength and improve posture in order to meet functional goals.    PT Treatment/Interventions ADLs/Self Care Home Management;Biofeedback;Cryotherapy;Electrical Stimulation;Moist Heat;Therapeutic activities;Therapeutic exercise;Neuromuscular re-education;Patient/family education;Manual techniques;Passive range of motion;Dry needling;Taping    PT Next Visit Plan f/u on dry needling to thoracic and lumbar multifidi, core strength progression, pelvic tilt on mat and core strength glute med strength from mat to standing as able    PT Home Exercise Plan Access Code: [email or web address]    Consulted and Agree with Plan of Care Patient;Other (Comment)           Patient will benefit from skilled therapeutic intervention in order to improve the following deficits and impairments:  Pain,Postural dysfunction,Impaired flexibility,Increased fascial restricitons,Decreased strength,Decreased range of motion,Increased muscle spasms  Visit Diagnosis: Cramp and spasm  Muscle weakness (generalized)  Abnormal  posture     Problem List Patient Active Problem List   Diagnosis Date Noted  . Nonfunctioning kidney 12/20/2019  . 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 Language barrier 10/26/2019  . Partial molar pregnancy 10/08/2019  . Missed abortion 09/06/2019  . Abnormal ultrasound of left kidney 09/06/2019    Junious Silk, PT 06/29/2020, 12:47 PM  [facility] [address] [place], [place], [address] Phone: [phone]   Fax:  [phone]  Name: Jackie Nelson MRN: [id number] Date of Birth: Jul 05, 1980

## 2020-06-29 NOTE — Patient Instructions (Signed)
Access Code: [email or web address] URL: [email or web address] Date: 06/29/2020 Prepared by: Dwana Curd  Exercises Supine Lower Trunk Rotation - 1 x daily - 7 x weekly - 1 sets - 10 reps - 10 sec hold Sidelying Thoracic Rotation with Open Book - 1 x daily - 7 x weekly - 5 reps - 1 sets - 10 sec hold Supine Butterfly Groin Stretch - 1 x daily - 7 x weekly - 3 reps - 1 sets - 30 sec hold Supine Figure 4 Piriformis Stretch - 1 x daily - 7 x weekly - 3 reps - 1 sets - 30 sec hold Sidelying Bent Knee Hip Flexion - 1 x daily - 7 x weekly - 3 sets - 10 reps Standing Hamstring Stretch with Step - 1 x daily - 7 x weekly - 3 reps - 1 sets - 30 sec hold Bent Knee Fallouts - 1 x daily - 7 x weekly - 3 sets - 10 reps Clamshell - 1 x daily - 7 x weekly - 3 sets - 10 reps Standing Lumbar Extension at Wall - Forearms - 3 x daily - 7 x weekly - 1 sets - 10 reps - 5 hold Half Kneeling Hip Flexor Stretch with Sidebend - 1 x daily - 7 x weekly - 3 sets - 10 reps Shoulder Extension with Resistance - Palms Forward - 1 x daily - 7 x weekly - 3 sets - 10 reps Standing Anti-Rotation Press with Anchored Resistance - 1 x daily - 7 x weekly - 3 sets - 10 reps  Patient Education Trigger Point Dry Needling

## 2020-07-06 ENCOUNTER — Ambulatory Visit: Payer: Medicaid Other | Admitting: Physical Therapy

## 2020-07-06 ENCOUNTER — Encounter: Payer: Self-pay | Admitting: Physical Therapy

## 2020-07-06 ENCOUNTER — Other Ambulatory Visit: Payer: Self-pay

## 2020-07-06 DIAGNOSIS — M6281 Muscle weakness (generalized): Secondary | ICD-10-CM

## 2020-07-06 DIAGNOSIS — R293 Abnormal posture: Secondary | ICD-10-CM

## 2020-07-06 DIAGNOSIS — R252 Cramp and spasm: Secondary | ICD-10-CM

## 2020-07-06 NOTE — Patient Instructions (Signed)
Access Code: [email or web address] URL: [email or web address] Date: 07/06/2020 Prepared by: Dwana Curd  Exercises Supine Lower Trunk Rotation - 1 x daily - 7 x weekly - 1 sets - 10 reps - 10 sec hold Sidelying Thoracic Rotation with Open Book - 1 x daily - 7 x weekly - 5 reps - 1 sets - 10 sec hold Supine Butterfly Groin Stretch - 1 x daily - 7 x weekly - 3 reps - 1 sets - 30 sec hold Supine Figure 4 Piriformis Stretch - 1 x daily - 7 x weekly - 3 reps - 1 sets - 30 sec hold Sidelying Bent Knee Hip Flexion - 1 x daily - 7 x weekly - 3 sets - 10 reps Standing Hamstring Stretch with Step - 1 x daily - 7 x weekly - 3 reps - 1 sets - 30 sec hold Bent Knee Fallouts - 1 x daily - 7 x weekly - 3 sets - 10 reps Clamshell - 1 x daily - 7 x weekly - 3 sets - 10 reps Standing Lumbar Extension at Wall - Forearms - 3 x daily - 7 x weekly - 1 sets - 10 reps - 5 hold Half Kneeling Hip Flexor Stretch with Sidebend - 1 x daily - 7 x weekly - 3 sets - 10 reps Shoulder Extension with Resistance - Palms Forward - 1 x daily - 7 x weekly - 3 sets - 10 reps Standing Anti-Rotation Press with Anchored Resistance - 1 x daily - 7 x weekly - 3 sets - 10 reps Mini Squat with Chair - 1 x daily - 7 x weekly - 2 sets - 10 reps Hooklying Small March - 1 x daily - 7 x weekly - 10 reps - 2 sets  Patient Education Trigger Point Dry Needling

## 2020-07-06 NOTE — Therapy (Signed)
[facility] [address], [address] [place], [place], [address] Phone: [phone]   Fax:  [phone]  Physical Therapy Treatment  Patient Details  Name: Jackie Nelson MRN: [id number] Date of Birth: 06/06/1980 Referring Provider (PT): Crista Elliot, MD   Encounter Date: 07/06/2020   PT End of Session - 07/06/20 1022    Visit Number 5    Date for PT Re-Evaluation 07/17/20    Authorization Type healthy blue medicaid    PT Start Time 1015    PT Stop Time 1100    PT Time Calculation (min) 45 min    Activity Tolerance Patient tolerated treatment well    Behavior During Therapy [facility] for tasks assessed/performed           Past Medical History:  Diagnosis Date  . Atrophic kidney   . Molar pregnancy 09/2019    Past Surgical History:  Procedure Laterality Date  . DILATION AND EVACUATION N/A 02/13/2018   Procedure: DILATATION AND EVACUATION;  Surgeon: Conan Bowens, MD;  Location: [facility];  Service: Gynecology;  Laterality: N/A;  . DILATION AND EVACUATION N/A 09/08/2019   Procedure: DILATATION AND EVACUATION;  Surgeon: Adam Phenix, MD;  Location: [facility] OR;  Service: Gynecology;  Laterality: N/A;  . LAPAROSCOPIC NEPHRECTOMY, HAND ASSISTED Left 12/20/2019   Procedure: LEFT LAPAROSCOPIC NEPHRECTOMY;  Surgeon: Rene Paci, MD;  Location: [facility];  Service: Urology;  Laterality: Left;    There were no vitals filed for this visit.   Subjective Assessment - 07/06/20 1018    Subjective Pt states it was very sore after the needling but now it better. Pt states I just has a lot of gas and that is uncomfortable.  Pt states the pain is only up to 2/10.  States there is pain sometimes in the low back.  I have pain when lifting things, I am still not lifting heavy things.    Patient is accompained by: Interpreter    Patient Stated Goals reduce pain with pressure and be able to cough or stretch without pain    Currently in  Pain? No/denies                             [facility] Adult PT Treatment/Exercise - 07/06/20 0001      Lumbar Exercises: Stretches   Other Lumbar Stretch Exercise lumbar on foam roll - DKTC, SKTC with hip ext other leg    Other Lumbar Stretch Exercise pirifomis stretch increased LBP      Lumbar Exercises: Standing   Functional Squats Limitations cues with foam roll - hinge at hips, keep spine neutral      Lumbar Exercises: Supine   Pelvic Tilt 20 reps    Bent Knee Raise 20 reps    Straight Leg Raise 10 reps      Manual Therapy   Soft tissue mobilization gluteals    Myofascial Release stomach mobility                  PT Education - 07/06/20 1110    Education Details Access Code: [email or web address]    Person(s) Educated Patient    Methods Explanation;Demonstration;Tactile cues;Verbal cues;Handout    Comprehension Verbalized understanding;Returned demonstration            PT Short Term Goals - 06/15/20 0805      PT SHORT TERM GOAL #1   Title pt will be ind with initial HEP  Status Achieved             PT Long Term Goals - 07/06/20 1031      PT LONG TERM GOAL #1   Title Pt will report <3/10 abdominal pain due to improved pain management    Baseline 2/10    Status Achieved      PT LONG TERM GOAL #2   Title Pt will be ind with advanced HEP for maintaining improved function    Status On-going      PT LONG TERM GOAL #3   Title Pt will demonstrate single leg stand without trendelenburge for 10 seconds bilaterally due to improved core and hip strength      PT LONG TERM GOAL #4   Title Pt will be able to perform squat with correct technique due to improved core and hip strength                 Plan - 07/06/20 1111    Clinical Impression Statement Pt has made progress towards goals with less pain.  Pt is now having more pain in low back. She had good response from [facility] to Lt gluteals.  Today's session also focused on improved mechanics during  functional squat so she can return to correctly lifting at home with reduced risk of injury.  Pt needed max verbal and tactile cues to do correctly and not bend from the back and use hip movements and brace with core instead. Pt will benefit from skilled PT to continue to work on functional goals.    PT Treatment/Interventions ADLs/Self Care Home Management;Biofeedback;Cryotherapy;Electrical Stimulation;Moist Heat;Therapeutic activities;Therapeutic exercise;Neuromuscular re-education;Patient/family education;Manual techniques;Passive range of motion;Dry needling;Taping    PT Next Visit Plan squat progression, f/u on pelvic tilt, hip hinge and lifting, gluteal strength and hip/glute stretches    PT Home Exercise Plan Access Code: [email or web address]    Consulted and Agree with Plan of Care Patient;Other (Comment)           Patient will benefit from skilled therapeutic intervention in order to improve the following deficits and impairments:  Pain,Postural dysfunction,Impaired flexibility,Increased fascial restricitons,Decreased strength,Decreased range of motion,Increased muscle spasms  Visit Diagnosis: Cramp and spasm  Muscle weakness (generalized)  Abnormal posture     Problem List Patient Active Problem List   Diagnosis Date Noted  . Nonfunctioning kidney 12/20/2019  . 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 Language barrier 10/26/2019  . Partial molar pregnancy 10/08/2019  . Missed abortion 09/06/2019  . Abnormal ultrasound of left kidney 09/06/2019    Junious Silk, PT 07/06/2020, 11:26 AM  [facility] [address] [place], [place], [address] Phone: [phone]   Fax:  [phone]  Name: Davona Kinoshita MRN: [id number] Date of Birth: Dec 21, 1980

## 2020-07-13 ENCOUNTER — Ambulatory Visit: Payer: Medicaid Other | Attending: Urology | Admitting: Physical Therapy

## 2020-07-13 DIAGNOSIS — R252 Cramp and spasm: Secondary | ICD-10-CM | POA: Insufficient documentation

## 2020-07-13 DIAGNOSIS — M6281 Muscle weakness (generalized): Secondary | ICD-10-CM | POA: Insufficient documentation

## 2020-07-13 DIAGNOSIS — R293 Abnormal posture: Secondary | ICD-10-CM | POA: Insufficient documentation

## 2020-07-17 ENCOUNTER — Ambulatory Visit: Payer: Medicaid Other | Admitting: Nurse Practitioner

## 2020-07-20 ENCOUNTER — Other Ambulatory Visit: Payer: Self-pay

## 2020-07-20 ENCOUNTER — Ambulatory Visit: Payer: Medicaid Other | Admitting: Physical Therapy

## 2020-07-20 DIAGNOSIS — R252 Cramp and spasm: Secondary | ICD-10-CM

## 2020-07-20 DIAGNOSIS — M6281 Muscle weakness (generalized): Secondary | ICD-10-CM

## 2020-07-20 DIAGNOSIS — R293 Abnormal posture: Secondary | ICD-10-CM

## 2020-07-20 NOTE — Patient Instructions (Signed)
Access Code: [email or web address] URL: [email or web address] Date: 07/20/2020 Prepared by: Dwana Curd  Exercises Supine Lower Trunk Rotation - 1 x daily - 7 x weekly - 1 sets - 10 reps - 10 sec hold Sidelying Thoracic Rotation with Open Book - 1 x daily - 7 x weekly - 5 reps - 1 sets - 10 sec hold Supine Butterfly Groin Stretch - 1 x daily - 7 x weekly - 3 reps - 1 sets - 30 sec hold Supine Figure 4 Piriformis Stretch - 1 x daily - 7 x weekly - 3 reps - 1 sets - 30 sec hold Sidelying Bent Knee Hip Flexion - 1 x daily - 7 x weekly - 3 sets - 10 reps Standing Hamstring Stretch with Step - 1 x daily - 7 x weekly - 3 reps - 1 sets - 30 sec hold Bent Knee Fallouts - 1 x daily - 7 x weekly - 3 sets - 10 reps Clamshell - 1 x daily - 7 x weekly - 3 sets - 10 reps Standing Lumbar Extension at Wall - Forearms - 3 x daily - 7 x weekly - 1 sets - 10 reps - 5 hold Half Kneeling Hip Flexor Stretch with Sidebend - 1 x daily - 7 x weekly - 3 sets - 10 reps Shoulder Extension with Resistance - Palms Forward - 1 x daily - 7 x weekly - 3 sets - 10 reps Standing Anti-Rotation Press with Anchored Resistance - 1 x daily - 7 x weekly - 3 sets - 10 reps Mini Squat with Chair - 1 x daily - 7 x weekly - 2 sets - 10 reps Hooklying Small March - 1 x daily - 7 x weekly - 10 reps - 2 sets Supine Pelvic Floor Stretch - Hands on Knees - 1 x daily - 7 x weekly - 1 sets - 3 reps - 30 hold Supine Posterior Pelvic Tilt - 1 x daily - 7 x weekly - 3 sets - 10 reps  Patient Education Trigger Point Dry Needling

## 2020-07-20 NOTE — Therapy (Signed)
[facility] [address], [address] [place], [place], [address] Phone: [phone]   Fax:  [phone]  Physical Therapy Treatment  Patient Details  Name: Jackie Nelson MRN: [id number] Date of Birth: 25-Nov-1980 Referring Provider (PT): Crista Elliot, MD   Encounter Date: 07/20/2020   PT End of Session - 07/20/20 1001    Visit Number 6    Date for PT Re-Evaluation 07/17/20    Authorization Type healthy blue medicaid 12 visits to 6/6    Authorization - Visit Number 5    Authorization - Number of Visits 12    PT Start Time 1015    PT Stop Time 1053    PT Time Calculation (min) 38 min    Activity Tolerance Patient tolerated treatment well    Behavior During Therapy [facility] for tasks assessed/performed           Past Medical History:  Diagnosis Date  . Atrophic kidney   . Molar pregnancy 09/2019    Past Surgical History:  Procedure Laterality Date  . DILATION AND EVACUATION N/A 02/13/2018   Procedure: DILATATION AND EVACUATION;  Surgeon: Conan Bowens, MD;  Location: [facility];  Service: Gynecology;  Laterality: N/A;  . DILATION AND EVACUATION N/A 09/08/2019   Procedure: DILATATION AND EVACUATION;  Surgeon: Adam Phenix, MD;  Location: [facility] OR;  Service: Gynecology;  Laterality: N/A;  . LAPAROSCOPIC NEPHRECTOMY, HAND ASSISTED Left 12/20/2019   Procedure: LEFT LAPAROSCOPIC NEPHRECTOMY;  Surgeon: Rene Paci, MD;  Location: [facility];  Service: Urology;  Laterality: Left;    There were no vitals filed for this visit.   Subjective Assessment - 07/20/20 1017    Subjective A little bit of pain in the low back.    Patient is accompained by: Interpreter    Patient Stated Goals reduce pain with pressure and be able to cough or stretch without pain                             [facility] Adult PT Treatment/Exercise - 07/20/20 0001      Lumbar Exercises: Stretches   Other Lumbar Stretch Exercise pool  noodle under low back- happy baby and rocking      Lumbar Exercises: Supine   Pelvic Tilt 20 reps    Pelvic Tilt Limitations min/mod TC      Manual Therapy   Soft tissue mobilization gluteals, lumbar and thoracic paraspinlas; addaday to glutes, sacral distraction            Trigger Point Dry Needling - 07/20/20 0001    Consent Given? Yes    Education Handout Provided Previously provided    Lumbar multifidi Response Twitch response elicited;Palpable increased muscle length    Thoracic multifidi response Twitch response elicited;Palpable increased muscle length                PT Education - 07/20/20 1109    Education Details Access Code: [email or web address]    Person(s) Educated Patient    Methods Explanation;Demonstration;Tactile cues;Verbal cues;Handout    Comprehension Verbalized understanding;Returned demonstration            PT Short Term Goals - 06/15/20 0805      PT SHORT TERM GOAL #1   Title pt will be ind with initial HEP    Status Achieved             PT Long Term Goals - 07/06/20 1031  PT LONG TERM GOAL #1   Title Pt will report <3/10 abdominal pain due to improved pain management    Baseline 2/10    Status Achieved      PT LONG TERM GOAL #2   Title Pt will be ind with advanced HEP for maintaining improved function    Status On-going      PT LONG TERM GOAL #3   Title Pt will demonstrate single leg stand without trendelenburge for 10 seconds bilaterally due to improved core and hip strength      PT LONG TERM GOAL #4   Title Pt will be able to perform squat with correct technique due to improved core and hip strength                 Plan - 07/20/20 1118    Clinical Impression Statement Pt responded well to STM and dry needling.  Improved soft tissue length was palpated.  Pt was given stretch and exercise as shown to maintain improved soft tissue and work on improved posture.  Pt will benefit from skilled PT to continue with dry needling for  improved soft tissue and posture    PT Treatment/Interventions ADLs/Self Care Home Management;Biofeedback;Cryotherapy;Electrical Stimulation;Moist Heat;Therapeutic activities;Therapeutic exercise;Neuromuscular re-education;Patient/family education;Manual techniques;Passive range of motion;Dry needling;Taping    PT Next Visit Plan f/u on DN #2; do #3 and pelvic tilt add squat back in    PT Home Exercise Plan Access Code: [email or web address]    Consulted and Agree with Plan of Care Patient;Other (Comment)           Patient will benefit from skilled therapeutic intervention in order to improve the following deficits and impairments:  Pain,Postural dysfunction,Impaired flexibility,Increased fascial restricitons,Decreased strength,Decreased range of motion,Increased muscle spasms  Visit Diagnosis: Cramp and spasm  Muscle weakness (generalized)  Abnormal posture     Problem List Patient Active Problem List   Diagnosis Date Noted  . Nonfunctioning kidney 12/20/2019  . 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 Language barrier 10/26/2019  . Partial molar pregnancy 10/08/2019  . Missed abortion 09/06/2019  . Abnormal ultrasound of left kidney 09/06/2019    Junious Silk, PT  07/20/2020, 11:23 AM  [facility] [address], [address] [place], [place], [address] Phone: [phone]   Fax:  [phone]  Name: Jackie Nelson MRN: [id number] Date of Birth: 02/06/1981

## 2020-07-26 ENCOUNTER — Ambulatory Visit: Payer: Medicaid Other | Attending: Nurse Practitioner | Admitting: Family Medicine

## 2020-07-26 ENCOUNTER — Encounter: Payer: Self-pay | Admitting: Family Medicine

## 2020-07-26 ENCOUNTER — Other Ambulatory Visit: Payer: Self-pay

## 2020-07-26 VITALS — BP 140/81 | HR 70 | Ht 65.0 in | Wt 196.6 lb

## 2020-07-26 DIAGNOSIS — D649 Anemia, unspecified: Secondary | ICD-10-CM | POA: Diagnosis not present

## 2020-07-26 DIAGNOSIS — R059 Cough, unspecified: Secondary | ICD-10-CM

## 2020-07-26 DIAGNOSIS — R7303 Prediabetes: Secondary | ICD-10-CM

## 2020-07-26 MED ORDER — CETIRIZINE HCL 10 MG PO TABS: 10.0000 mg | ORAL_TABLET | Freq: Every day | ORAL | 1 refills | Status: DC

## 2020-07-26 MED ORDER — FLUTICASONE PROPIONATE 50 MCG/ACT NA SUSP: 1.0000 | Freq: Every day | NASAL | 2 refills | Status: DC

## 2020-07-26 NOTE — Progress Notes (Signed)
Still has cough

## 2020-07-26 NOTE — Progress Notes (Signed)
Subjective:  Patient ID: Jackie Nelson, female    DOB: 28-Jun-1980  Age: 40 y.o. MRN: [id number]  CC: Cough   HPI Kamie Korber is a 40 year old female patient of Bertram Denver, FNP with a history of left laparoscopic nephrectomy (due to nonfunctioning left kidney), prediabetes who presents today with acute concerns.  Interval History: She complains of her ears itching, throat itching and cough and she is unsure of the duration of symptoms. Cough is dry.  Sometimes has postnasal drip but has no facial pressure, rhinorrhea, nasal congestion. She was anemic in 01/2020 with a hemoglobin of 8.6; currently has no menstrual cycles she states after her D&C Past Medical History:  Diagnosis Date  . Atrophic kidney   . Molar pregnancy 09/2019    Past Surgical History:  Procedure Laterality Date  . DILATION AND EVACUATION N/A 02/13/2018   Procedure: DILATATION AND EVACUATION;  Surgeon: Conan Bowens, MD;  Location: [facility];  Service: Gynecology;  Laterality: N/A;  . DILATION AND EVACUATION N/A 09/08/2019   Procedure: DILATATION AND EVACUATION;  Surgeon: Adam Phenix, MD;  Location: [facility] OR;  Service: Gynecology;  Laterality: N/A;  . LAPAROSCOPIC NEPHRECTOMY, HAND ASSISTED Left 12/20/2019   Procedure: LEFT LAPAROSCOPIC NEPHRECTOMY;  Surgeon: Rene Paci, MD;  Location: [facility];  Service: Urology;  Laterality: Left;    Family History  Problem Relation Age of Onset  . Diabetes Mother     No Known Allergies  Outpatient Medications Prior to Visit  Medication Sig Dispense Refill  . acetaminophen (TYLENOL) 325 MG tablet Take 650 mg by mouth every 6 (six) hours as needed. (Patient not taking: No sig reported)    . fluticasone (FLONASE) 50 MCG/ACT nasal spray Place 1 spray into both nostrils daily. (Patient not taking: No sig reported) 16 g 2  . medroxyPROGESTERone (DEPO-PROVERA) 150 MG/ML injection Inject 1 mL (150 mg total) into the muscle every 3 (three) months. (Patient not taking:  Reported on 07/26/2020) 1 mL 0   No facility-administered medications prior to visit.     ROS Review of Systems  Constitutional: Negative for activity change, appetite change and fatigue.  HENT: Positive for postnasal drip. Negative for congestion, sinus pressure and sore throat.   Eyes: Negative for visual disturbance.  Respiratory: Positive for cough. Negative for chest tightness, shortness of breath and wheezing.   Cardiovascular: Negative for chest pain and palpitations.  Gastrointestinal: Negative for abdominal distention, abdominal pain and constipation.  Endocrine: Negative for polydipsia.  Genitourinary: Negative for dysuria and frequency.  Musculoskeletal: Negative for arthralgias and back pain.  Skin: Negative for rash.  Neurological: Negative for tremors, light-headedness and numbness.  Hematological: Does not bruise/bleed easily.  Psychiatric/Behavioral: Negative for agitation and behavioral problems.    Objective:  BP 140/81   Pulse 70   Ht 5\' 5"  (1.651 m)   Wt 196 lb 9.6 oz (89.2 kg)   SpO2 100%   BMI 32.72 kg/m   BP/Weight 07/26/2020 06/01/2020 03/16/2020  Systolic BP 140 141 131  Diastolic BP 81 90 79  Wt. (Lbs) 196.6 191.4 181  BMI 32.72 32.85 31.07      Physical Exam Constitutional:      Appearance: She is well-developed.  Neck:     Vascular: No JVD.  Cardiovascular:     Rate and Rhythm: Normal rate.     Heart sounds: Normal heart sounds. No murmur heard.   Pulmonary:     Effort: Pulmonary effort is normal.     Breath sounds: Normal  breath sounds. No wheezing or rales.  Chest:     Chest wall: No tenderness.  Abdominal:     General: Bowel sounds are normal. There is no distension.     Palpations: Abdomen is soft. There is no mass.     Tenderness: There is no abdominal tenderness.  Musculoskeletal:        General: Normal range of motion.     Right lower leg: No edema.     Left lower leg: No edema.  Neurological:     Mental Status: She is  alert and oriented to person, place, and time.  Psychiatric:        Mood and Affect: Mood normal.     CMP Latest Ref Rng & Units 01/19/2020 12/20/2019 12/13/2019  Glucose 65 - 99 mg/dL 85 191(Y) 94  BUN 6 - 20 mg/dL 11 13 15   Creatinine 0.57 - 1.00 mg/dL 7.82 9.56)  Sodium 134 - 144 mmol/L 140 138 137  Potassium 3.5 - 5.2 mmol/L 4.4 3.7 4.3  Chloride 96 - 106 mmol/L 100 106 105  CO2 20 - 29 mmol/L 23 23 25   Calcium 8.7 - 10.2 mg/dL 9.8 2.13(Y) 9.0  Total Protein 6.0 - 8.5 g/dL 8.2 - -  Total Bilirubin 0.0 - 1.2 mg/dL 0.3 - -  Alkaline Phos 44 - 121 IU/L 79 - -  AST 0 - 40 IU/L 9 - -  ALT 0 - 32 IU/L 17 - -    Lipid Panel     Component Value Date/Time   CHOL 169 01/19/2020 0936   TRIG 139 01/19/2020 0936   HDL 28 (L) 01/19/2020 0936   CHOLHDL 6.0 (H) 01/19/2020 0936   LDLCALC 116 (H) 01/19/2020 0936    CBC    Component Value Date/Time   WBC 6.0 01/19/2020 0936   WBC 3.7 (L) 12/13/2019 0830   RBC 3.59 (L) 01/19/2020 0936   RBC 4.71 12/13/2019 0830   HGB 8.5 (L) 01/19/2020 0936   HCT 27.9 (L) 01/19/2020 0936   PLT 554 (H) 01/19/2020 0936   MCV 78 (L) 01/19/2020 0936   MCH 23.7 (L) 01/19/2020 0936   MCH 25.9 (L) 12/13/2019 0830   MCHC 30.5 (L) 01/19/2020 0936   MCHC 31.8 12/13/2019 0830   RDW 16.2 (H) 01/19/2020 0936    Lab Results  Component Value Date   HGBA1C 6.0 (H) 01/19/2020    Assessment & Plan:  1. Prediabetes Controlled with A1c of 6.0 Currently not on medications Continue lifestyle modifications to prevent progression to diabetes mellitus - Hemoglobin A1c  2. Anemia, unspecified type As hemoglobin was 8.6 Will repeat - CBC with Differential/Platelet  3. Cough Likely of sinus etiology Placed on antihistamine and nasal spray - fluticasone (FLONASE) 50 MCG/ACT nasal spray; Place 1 spray into both nostrils daily.  Dispense: 16 g; Refill: 2 - cetirizine (ZYRTEC) 10 MG tablet; Take 1 tablet (10 mg total) by mouth daily.  Dispense: 30 tablet;  Refill: 1    No orders of the defined types were placed in this encounter.   Follow-up: Return in about 3 months (around 10/26/2020) for medical conditions with PCP.       13/12/2019, MD, FAAFP. [facility] [place], [facility] [place]   07/26/2020, 11:02 AM

## 2020-07-27 ENCOUNTER — Ambulatory Visit: Payer: Medicaid Other | Admitting: Physical Therapy

## 2020-07-27 ENCOUNTER — Other Ambulatory Visit: Payer: Self-pay

## 2020-07-27 DIAGNOSIS — M6281 Muscle weakness (generalized): Secondary | ICD-10-CM | POA: Diagnosis not present

## 2020-07-27 DIAGNOSIS — R293 Abnormal posture: Secondary | ICD-10-CM | POA: Diagnosis not present

## 2020-07-27 DIAGNOSIS — R252 Cramp and spasm: Secondary | ICD-10-CM

## 2020-07-27 LAB — CBC WITH DIFFERENTIAL/PLATELET
Basophils Absolute: 0 10*3/uL (ref 0.0–0.2)
Basos: 1 %
EOS (ABSOLUTE): 0.1 10*3/uL (ref 0.0–0.4)
Eos: 1 %
Hematocrit: 38 % (ref 34.0–46.6)
Hemoglobin: 12.2 g/dL (ref 11.1–15.9)
Immature Grans (Abs): 0 10*3/uL (ref 0.0–0.1)
Immature Granulocytes: 0 %
Lymphocytes Absolute: 1.5 10*3/uL (ref 0.7–3.1)
Lymphs: 34 %
MCH: 27.2 pg (ref 26.6–33.0)
MCHC: 32.1 g/dL (ref 31.5–35.7)
MCV: 85 fL (ref 79–97)
Monocytes Absolute: 0.4 10*3/uL (ref 0.1–0.9)
Monocytes: 9 %
Neutrophils Absolute: 2.4 10*3/uL (ref 1.4–7.0)
Neutrophils: 55 %
Platelets: 237 10*3/uL (ref 150–450)
RBC: 4.49 x10E6/uL (ref 3.77–5.28)
RDW: 14.5 % (ref 11.7–15.4)
WBC: 4.4 10*3/uL (ref 3.4–10.8)

## 2020-07-27 LAB — HEMOGLOBIN A1C
Est. average glucose Bld gHb Est-mCnc: 114 mg/dL
Hgb A1c MFr Bld: 5.6 % (ref 4.8–5.6)

## 2020-07-27 NOTE — Therapy (Addendum)
[facility] [address] [place], [place], [address] Phone: [phone]   Fax:  [phone]  Physical Therapy Treatment  Patient Details  Name: Jackie Nelson MRN: [id number] Date of Birth: 20-Jan-1981 Referring Provider (PT): Lucas Mallow, MD   Encounter Date: 07/27/2020   PT End of Session - 07/27/20 0854    Visit Number 7    Date for PT Re-Evaluation 07/17/20    Authorization Type healthy blue medicaid 12 visits to 6/6    PT Start Time 0848    PT Stop Time 0927    PT Time Calculation (min) 39 min    Activity Tolerance Patient tolerated treatment well    Behavior During Therapy [facility] for tasks assessed/performed           Past Medical History:  Diagnosis Date  . Atrophic kidney   . Molar pregnancy 09/2019    Past Surgical History:  Procedure Laterality Date  . DILATION AND EVACUATION N/A 02/13/2018   Procedure: DILATATION AND EVACUATION;  Surgeon: Sloan Leiter, MD;  Location: [facility];  Service: Gynecology;  Laterality: N/A;  . DILATION AND EVACUATION N/A 09/08/2019   Procedure: DILATATION AND EVACUATION;  Surgeon: Woodroe Mode, MD;  Location: [facility];  Service: Gynecology;  Laterality: N/A;  . LAPAROSCOPIC NEPHRECTOMY, HAND ASSISTED Left 12/20/2019   Procedure: LEFT LAPAROSCOPIC NEPHRECTOMY;  Surgeon: Ceasar Mons, MD;  Location: [facility];  Service: Urology;  Laterality: Left;    There were no vitals filed for this visit.   Subjective Assessment - 07/27/20 0856    Subjective Pain is the same like before.  Overall, it feels better.  it was sore after last time but it feels better now    Patient is accompained by: Interpreter    Patient Stated Goals reduce pain with pressure and be able to cough or stretch without pain    Currently in Pain? No/denies                             [facility] Adult PT Treatment/Exercise - 07/27/20 0001      Modalities   Modalities Electrical  Stimulation;Moist Heat      Moist Heat Therapy   Number Minutes Moist Heat 15 Minutes    Moist Heat Location Lumbar Spine      Electrical Stimulation   Electrical Stimulation Location lumbar    Electrical Stimulation Action IFC    Electrical Stimulation Parameters to tolerance    Electrical Stimulation Goals Pain      Manual Therapy   Soft tissue mobilization lumbar and thoracic paraspinals            Trigger Point Dry Needling - 07/27/20 0001    Consent Given? Yes    Education Handout Provided Previously provided    Lumbar multifidi Response Twitch response elicited;Palpable increased muscle length    Thoracic multifidi response Twitch response elicited;Palpable increased muscle length                  PT Short Term Goals - 06/15/20 0805      PT SHORT TERM GOAL #1   Title pt will be ind with initial HEP    Status Achieved             PT Long Term Goals - 07/06/20 1031      PT LONG TERM GOAL #1   Title Pt will report <3/10 abdominal pain due to  improved pain management    Baseline 2/10    Status Achieved      PT LONG TERM GOAL #2   Title Pt will be ind with advanced HEP for maintaining improved function    Status On-going      PT LONG TERM GOAL #3   Title Pt will demonstrate single leg stand without trendelenburge for 10 seconds bilaterally due to improved core and hip strength      PT LONG TERM GOAL #4   Title Pt will be able to perform squat with correct technique due to improved core and hip strength                 Plan - 07/27/20 0920    Clinical Impression Statement Pt is doing much better and reports no increased pain with walking or doing things around the home.  Pt still has tension in lumbar and thoracic spine but less that previous visit.  Pt will benefit from skilled PT to address soft tissue and posture for maximum function and ensure she is able to continue at home    PT Treatment/Interventions ADLs/Self Care Home  Management;Biofeedback;Cryotherapy;Electrical Stimulation;Moist Heat;Therapeutic activities;Therapeutic exercise;Neuromuscular re-education;Patient/family education;Manual techniques;Passive range of motion;Dry needling;Taping    PT Next Visit Plan f/u on DN #3; do #4 and pelvic tilt add squat back in    PT Home Exercise Plan Access Code: [email or web address]    Consulted and Agree with Plan of Care Patient           Patient will benefit from skilled therapeutic intervention in order to improve the following deficits and impairments:  Pain,Postural dysfunction,Impaired flexibility,Increased fascial restricitons,Decreased strength,Decreased range of motion,Increased muscle spasms  Visit Diagnosis: Cramp and spasm  Muscle weakness (generalized)  Abnormal posture     Problem List Patient Active Problem List   Diagnosis Date Noted  . Nonfunctioning kidney 12/20/2019  . 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 Language barrier 10/26/2019  . Partial molar pregnancy 10/08/2019  . Missed abortion 09/06/2019  . Abnormal ultrasound of left kidney 09/06/2019    Jule Ser, PT 07/27/2020, 9:24 AM  [facility] [address], [place], [place], [address] Phone: [phone]   Fax:  [phone]  Name: Jackie Nelson MRN: [id number] Date of Birth: 07-04-1980  PHYSICAL THERAPY DISCHARGE SUMMARY  Visits from Start of Care: 7  Current functional level related to goals / functional outcomes: See above goals met/partially met   Remaining deficits: See above   Education / Equipment: HEP  Plan: Patient agrees to discharge.  Patient goals were partially met. Patient is being discharged due to not returning since the last visit.  ?????    Pt did not arrive to final visit.  She was called at all numbers that were provided but numbers were invalid/not working.  Pt will be d/c today (08/10/20)  Janey Genta Elisheba Mcdonnell, PT 08/10/20 9:51 AM

## 2020-07-28 ENCOUNTER — Telehealth: Payer: Self-pay

## 2020-07-28 NOTE — Telephone Encounter (Signed)
-----   Message from Hoy Register, MD sent at 07/27/2020  8:13 AM EDT ----- Please inform the patient that labs are normal. Thank you.

## 2020-07-28 NOTE — Telephone Encounter (Signed)
Patient name and DOB has been verified Patient was informed of lab results. Patient had no questions.  

## 2020-08-03 ENCOUNTER — Encounter: Payer: Medicaid Other | Admitting: Physical Therapy

## 2020-08-03 DIAGNOSIS — N261 Atrophy of kidney (terminal): Secondary | ICD-10-CM | POA: Diagnosis not present

## 2020-08-03 DIAGNOSIS — R1032 Left lower quadrant pain: Secondary | ICD-10-CM | POA: Diagnosis not present

## 2020-08-10 ENCOUNTER — Ambulatory Visit: Payer: Medicaid Other | Attending: Urology | Admitting: Physical Therapy

## 2020-08-17 ENCOUNTER — Other Ambulatory Visit: Payer: Self-pay

## 2020-08-17 ENCOUNTER — Ambulatory Visit (INDEPENDENT_AMBULATORY_CARE_PROVIDER_SITE_OTHER): Payer: Medicaid Other

## 2020-08-17 VITALS — BP 134/88 | HR 72 | Wt 198.9 lb

## 2020-08-17 DIAGNOSIS — Z3042 Encounter for surveillance of injectable contraceptive: Secondary | ICD-10-CM | POA: Diagnosis not present

## 2020-08-17 MED ORDER — MEDROXYPROGESTERONE ACETATE 150 MG/ML IM SUSP
150.0000 mg | Freq: Once | INTRAMUSCULAR | Status: AC
  Administered 2020-08-17: 150 mg via INTRAMUSCULAR

## 2020-08-17 NOTE — Progress Notes (Signed)
Chart reviewed for nurse visit. Agree with plan of care.   Marny Lowenstein, PA-C 08/17/2020 10:18 AM

## 2020-08-17 NOTE — Progress Notes (Signed)
Jackie Nelson here for Depo-Provera Injection. Injection administered without complication. Patient will return in 3 months for next injection between Aug 25 and Sept 8, 2022. Next annual visit due Dec 2022.   Isabell Jarvis, RN 08/17/2020  9:16 AM  Pt states having headaches, fatigue every morning, back pain and weight gain and overall not happy with this method.  Denies any bleeding or cramps. Pt would like to take injection today, but wants to discuss possible birth control change around next injection time. Pt will set appt with front office today.   Judeth Cornfield, RN

## 2020-11-06 ENCOUNTER — Ambulatory Visit (INDEPENDENT_AMBULATORY_CARE_PROVIDER_SITE_OTHER): Payer: Medicaid Other | Admitting: Obstetrics and Gynecology

## 2020-11-06 ENCOUNTER — Other Ambulatory Visit: Payer: Self-pay

## 2020-11-06 ENCOUNTER — Encounter: Payer: Self-pay | Admitting: Obstetrics and Gynecology

## 2020-11-06 VITALS — BP 144/93 | HR 71 | Ht 64.0 in | Wt 195.8 lb

## 2020-11-06 DIAGNOSIS — Z309 Encounter for contraceptive management, unspecified: Secondary | ICD-10-CM | POA: Insufficient documentation

## 2020-11-06 DIAGNOSIS — Z1231 Encounter for screening mammogram for malignant neoplasm of breast: Secondary | ICD-10-CM | POA: Diagnosis not present

## 2020-11-06 DIAGNOSIS — Z30011 Encounter for initial prescription of contraceptive pills: Secondary | ICD-10-CM | POA: Diagnosis not present

## 2020-11-06 MED ORDER — NORGESTIMATE-ETH ESTRADIOL 0.25-35 MG-MCG PO TABS: 1.0000 | ORAL_TABLET | Freq: Every day | ORAL | 11 refills | Status: AC

## 2020-11-06 NOTE — Progress Notes (Signed)
Jackie Nelson presents to discuss change contraceptive methods. She currently is using Depo Provera. She is not happy with the wt gain and has HA also.  She is UTD date on pap smear  PE AF VSS Lungs clear Heart RRR Abd soft + BS  A/P Contracpetion management         Screening Mammogram         Discuss options with pt. She desires to try OCP's. U/R/B reviewed with pt. Advised to start this Sunday. Advised that cycles maybe irregular for several months until her body adjusts to the change. Will F/U in 3 months to reevaluate. Screening mammogram ordered. Live interrupter used during today's

## 2020-11-20 ENCOUNTER — Ambulatory Visit: Payer: Medicaid Other

## 2021-11-05 ENCOUNTER — Other Ambulatory Visit: Payer: Self-pay | Admitting: Obstetrics and Gynecology

## 2021-11-05 DIAGNOSIS — Z1231 Encounter for screening mammogram for malignant neoplasm of breast: Secondary | ICD-10-CM

## 2022-02-27 IMAGING — US US OB TRANSVAGINAL
1 series · 15 of 28 positions shown · non-contrast
Comparison: None for this gestation

CLINICAL DATA: First trimester pregnancy, Nexplanon removal
05/30/2019, LMP 05/30/2019, dating

EXAM:
TRANSVAGINAL OB ULTRASOUND
TECHNIQUE: Transvaginal ultrasound was performed for complete evaluation of the
gestation as well as the maternal uterus, adnexal regions, and
pelvic cul-de-sac.

[Series 1: us ob transvaginal · 47 acquisitions, 15 frames shown]
[im 1/47]
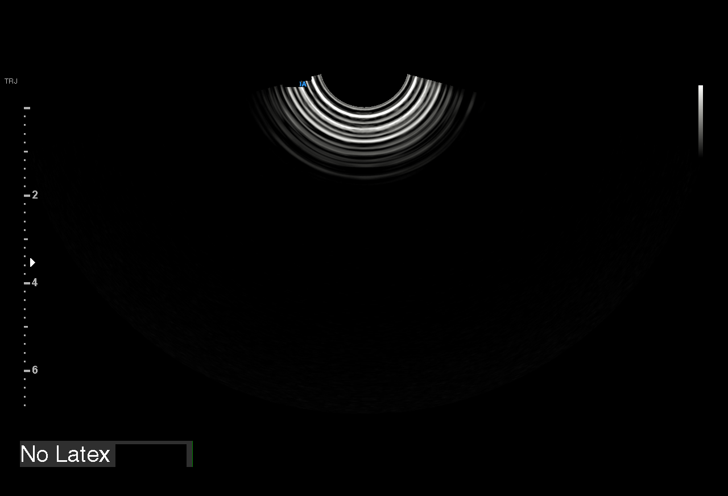
[im 4/47]
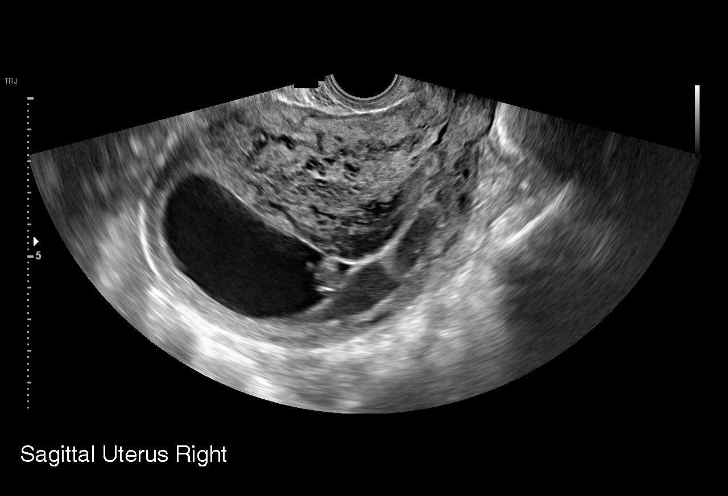
[im 7/47]
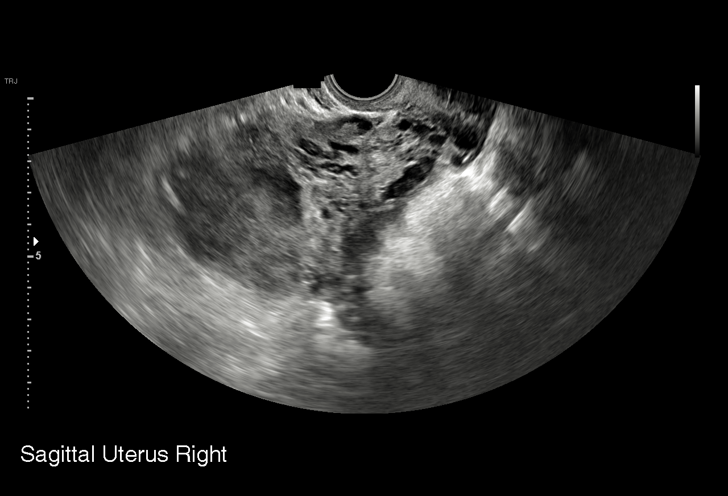
[im 11/47]
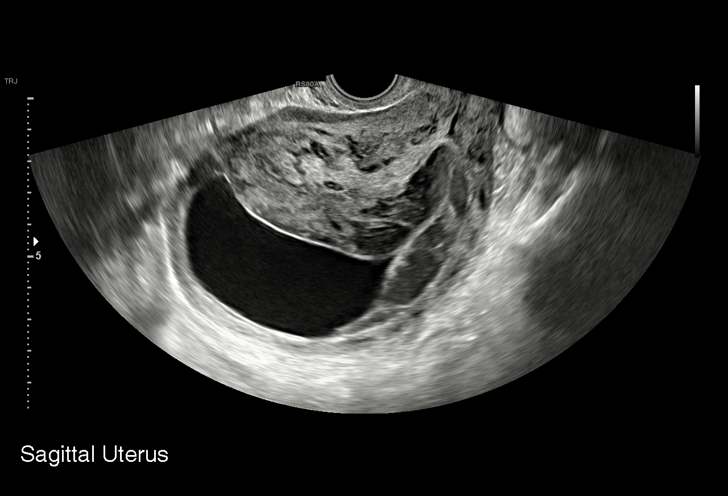
[im 14/47]
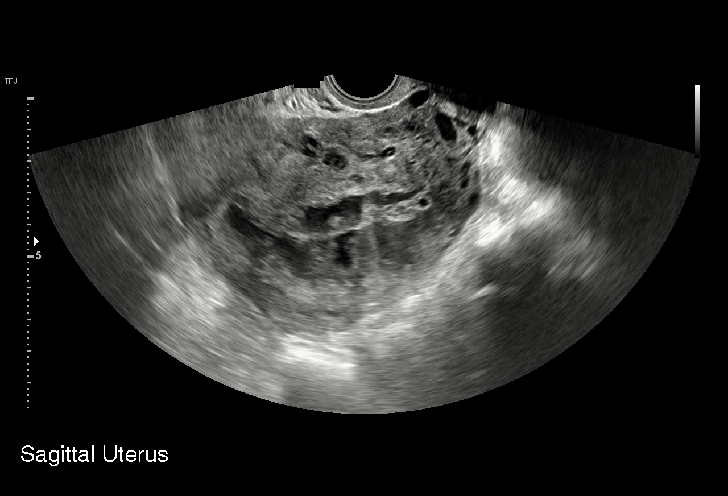
[im 18/47]
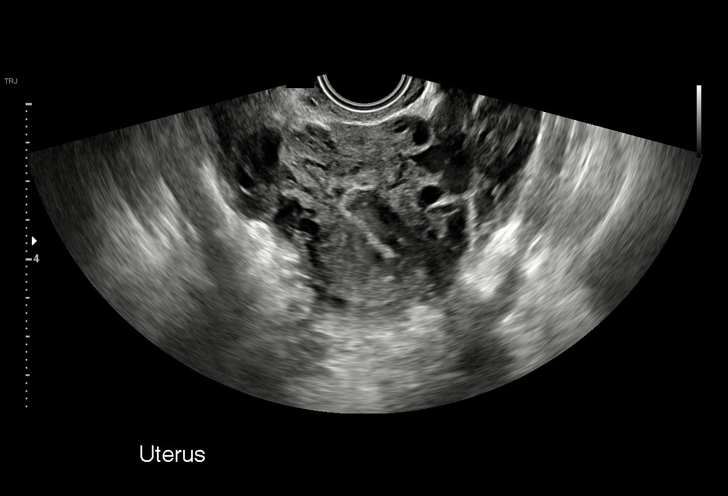
[im 21/47]
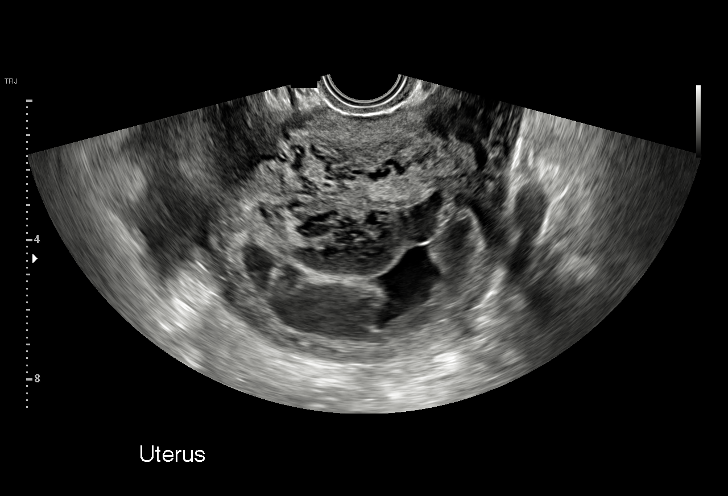
[im 24/47]
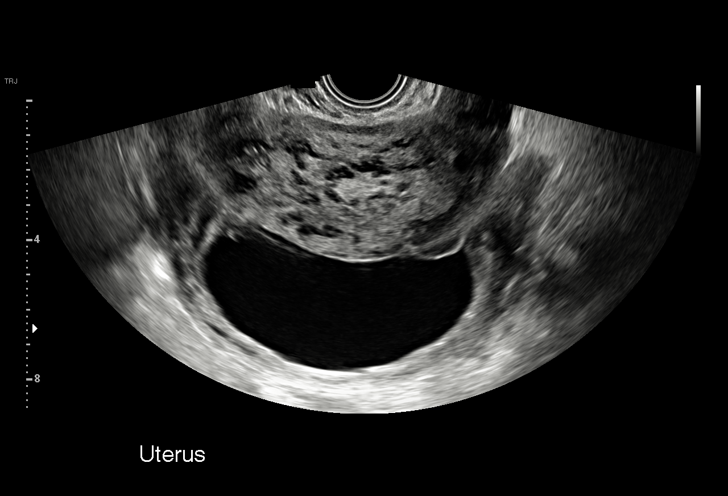
[im 26/47]
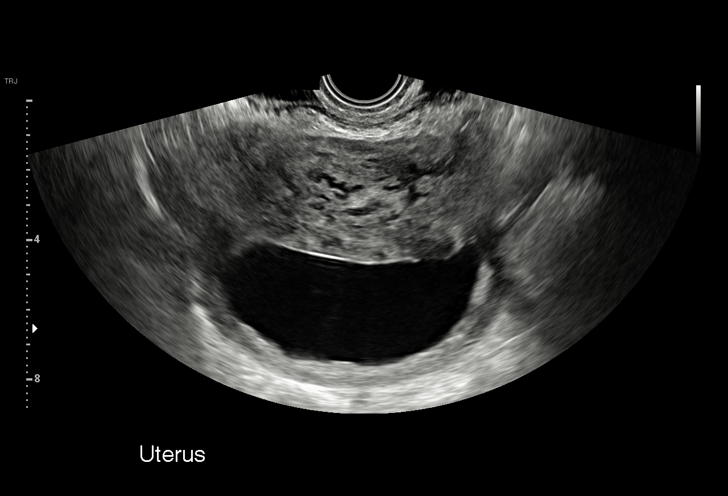
[im 29/47]
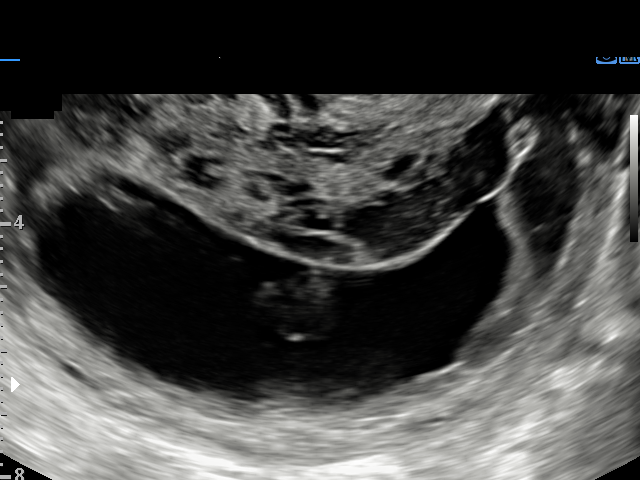
[im 33/47]
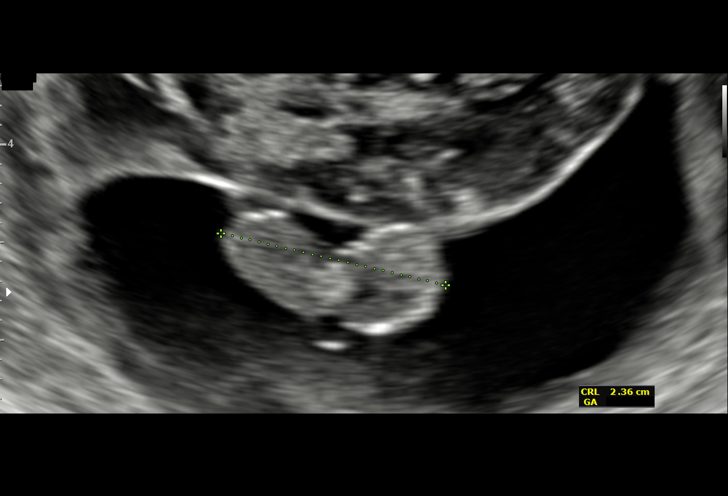
[im 36/47]
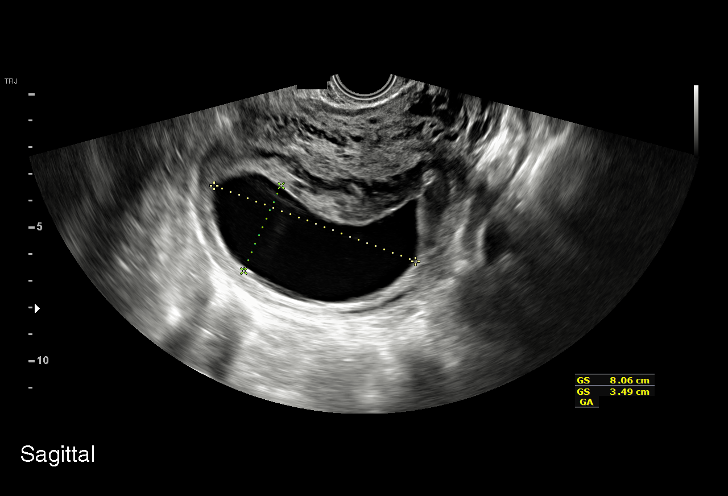
[im 40/47]
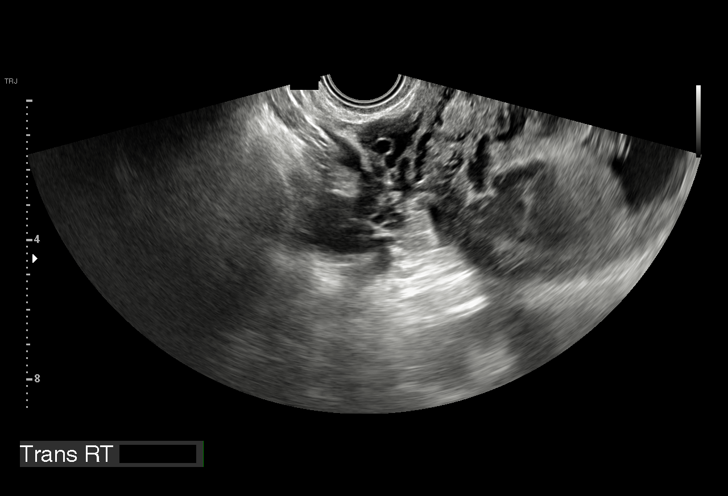
[im 43/47]
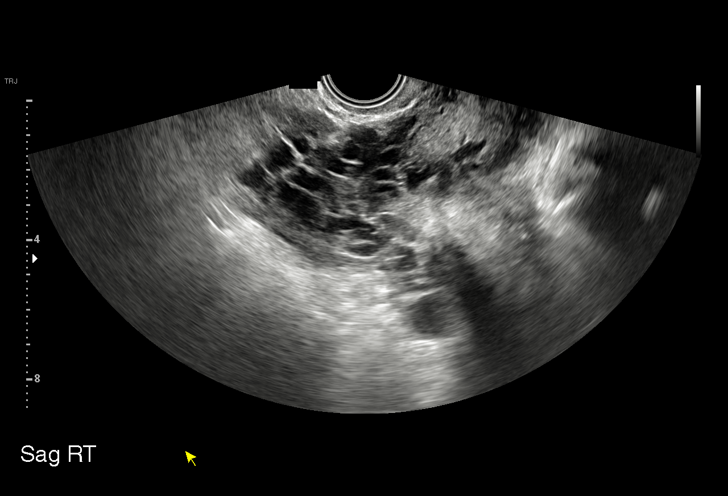
[im 47/47]
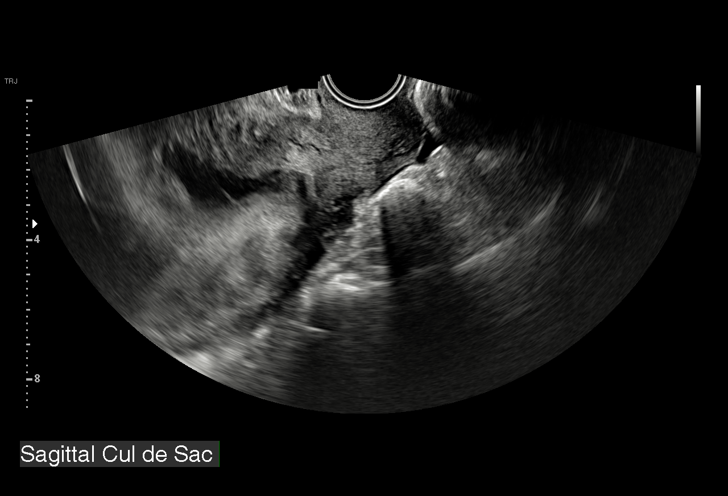

[15 of 28 positions shown; findings below may reference images not displayed]

FINDINGS: Intrauterine gestational sac: Present, single

Yolk sac:  Not identified

Embryo:  Present

Cardiac Activity: Not identified

Heart Rate: N/A bpm

MSD: 64.6 mm   12 w   5 d

CRL:   23.4 mm   9 w 0 d                  US EDC: 04/10/2020

Subchorionic hemorrhage:  None visualized.

Maternal uterus/adnexae:

Thickened heterogeneous and hydropic appearing placenta.

Enlarged gestational sac for size of fetal pole.

Remainder of uterus unremarkable.

LEFT ovary normal size and morphology 3.3 x 1.7 x 2.2 cm.

RIGHT ovary not visualized; no RIGHT adnexal mass is seen.

Trace free pelvic fluid.
IMPRESSION: Intrauterine gestational sac is identified containing a fetal pole
which lacks fetal cardiac activity.

Findings meet definitive criteria for failed pregnancy. This follows
SRU consensus guidelines: Diagnostic Criteria for Nonviable
Pregnancy Early in the First Trimester. N Engl J Med

Hydropic appearing placenta.

## 2022-02-27 IMAGING — US US RENAL
1 series · 15 of 25 positions shown · non-contrast
Comparison: None.

CLINICAL DATA: Left flank pain.  Urinary tract infection.

EXAM:
RENAL / URINARY TRACT ULTRASOUND COMPLETE

[Series 2: us renal · 33 acquisitions, 15 frames shown]
[im 1/33]
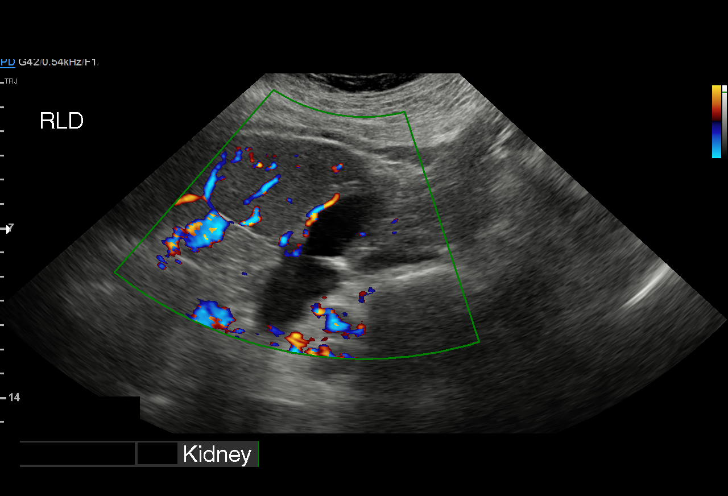
[im 3/33]
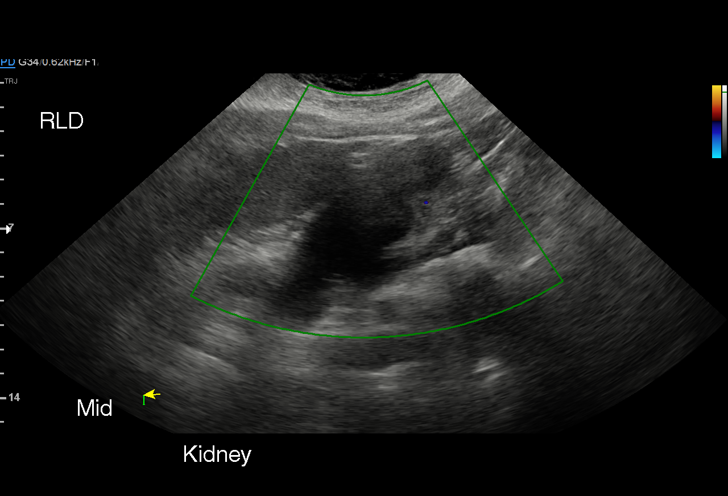
[im 6/33]
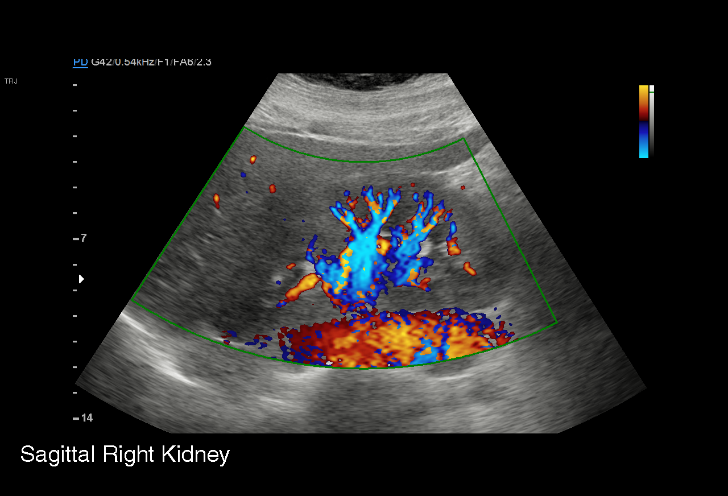
[im 7/33]
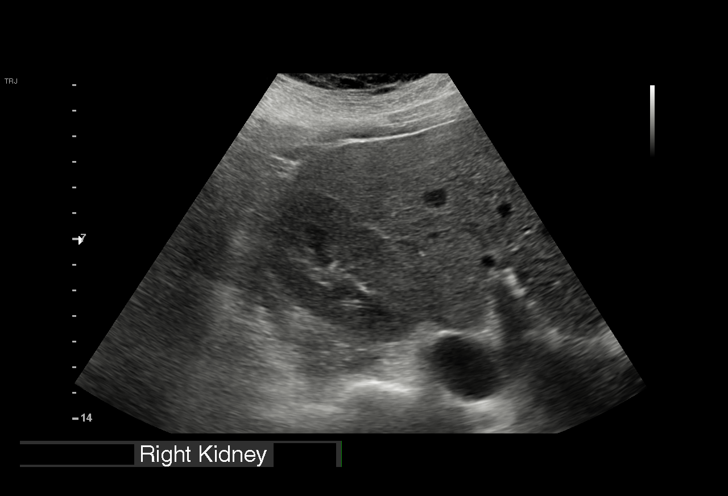
[im 10/33]
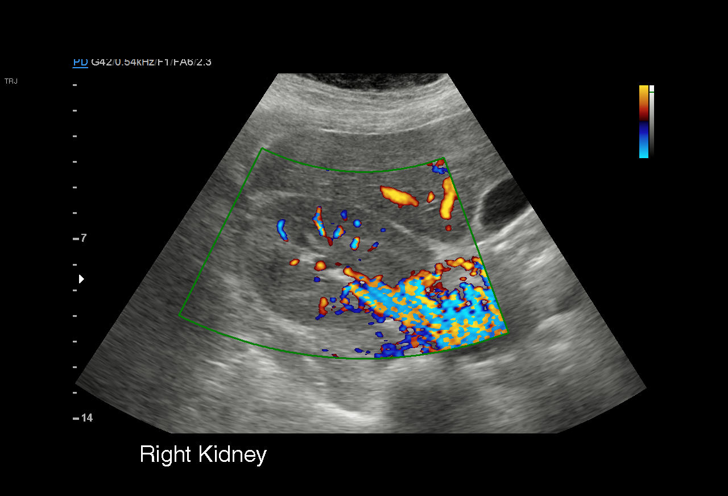
[im 13/33]
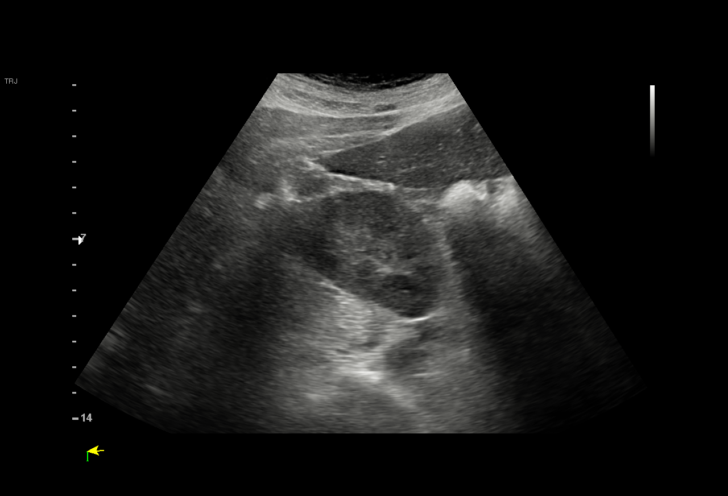
[im 14/33]
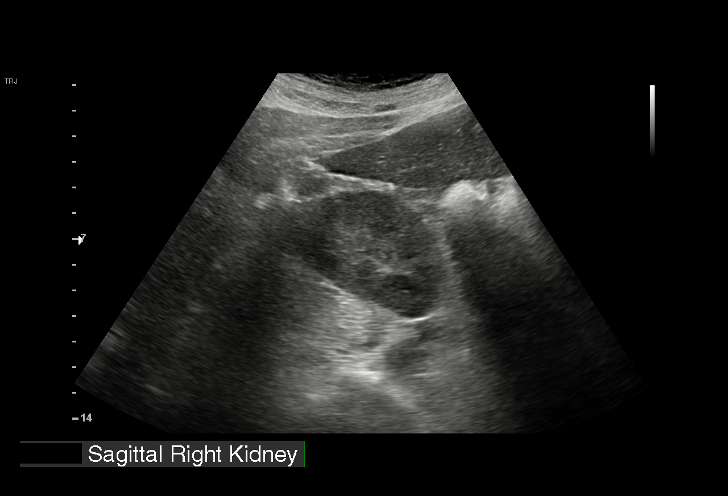
[im 17/33]
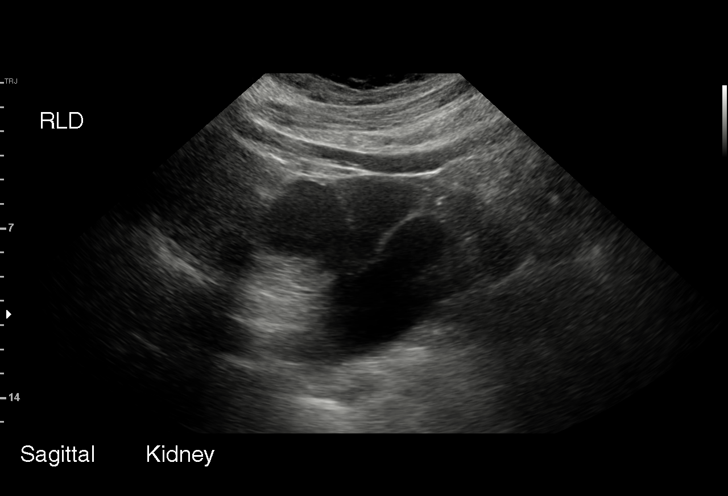
[im 19/33]
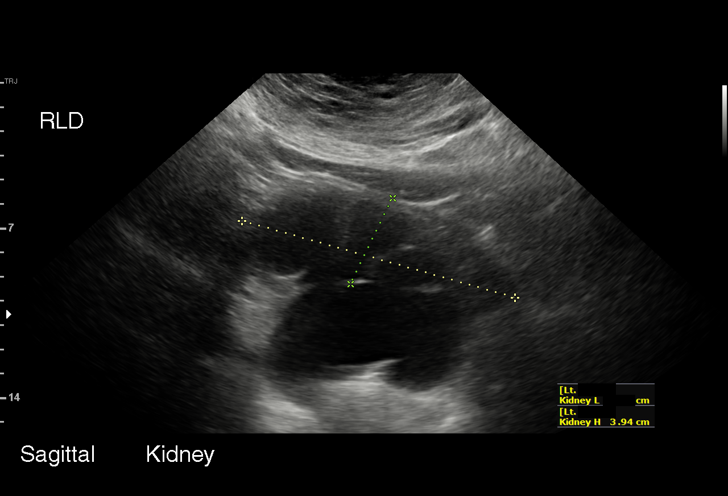
[im 21/33]
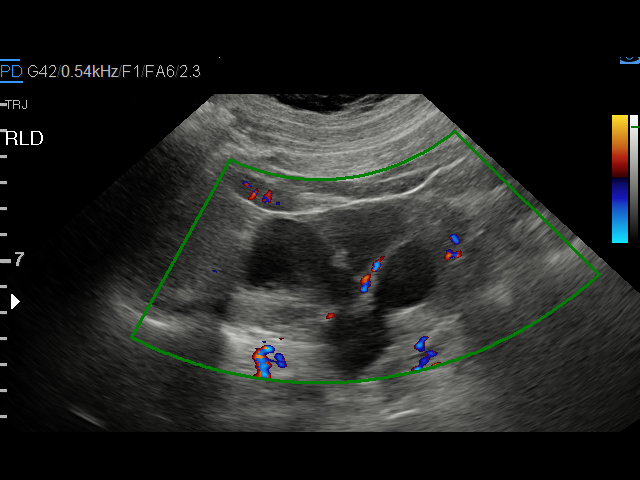
[im 23/33]
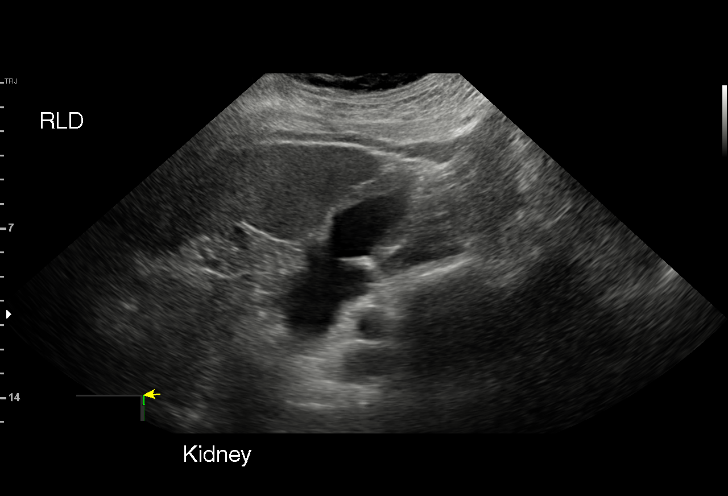
[im 26/33]
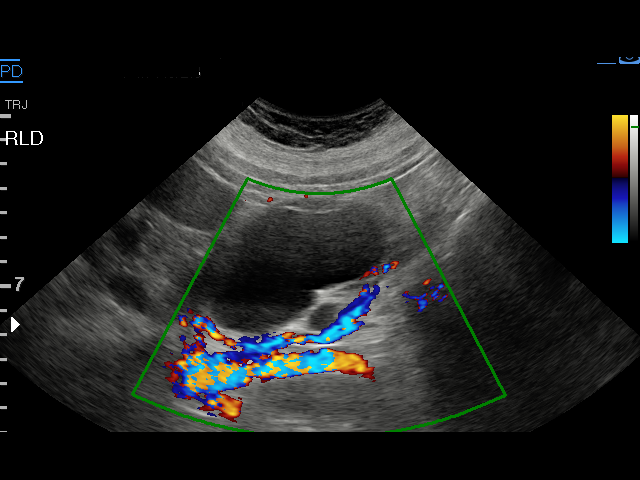
[im 27/33]
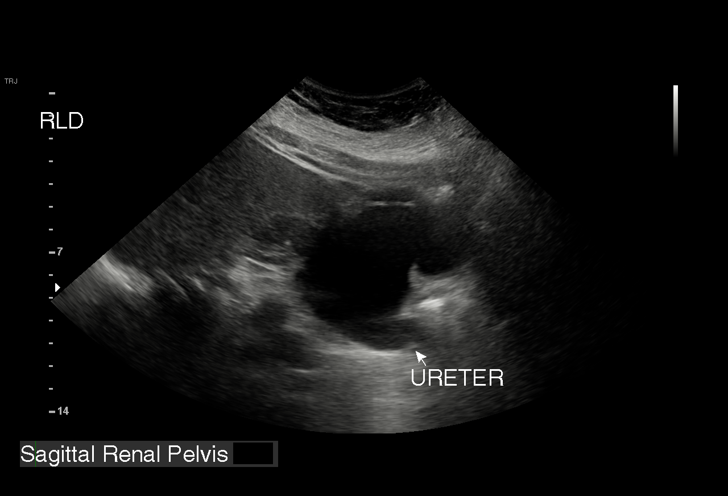
[im 30/33]
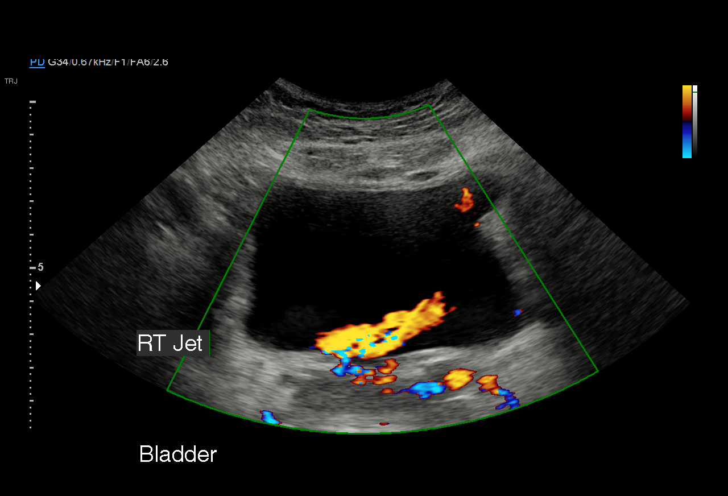
[im 33/33]
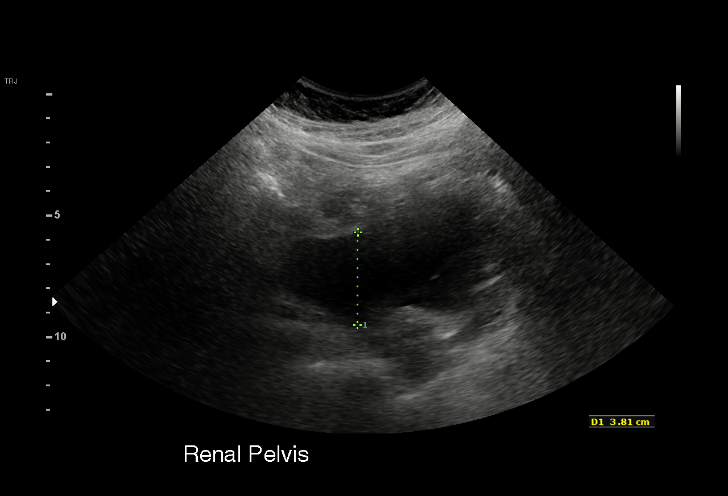

[15 of 25 positions shown; findings below may reference images not displayed]

FINDINGS: Right Kidney:

Renal measurements: 10.9 x 5.0 x 6.5 cm = volume: 186 mL .
Echogenicity within normal limits. No mass or hydronephrosis
visualized.

Left Kidney:

Renal measurements: 11.7 x 3.9 x 3.6 cm = volume: 87.4 mL. Marked
left hydronephrosis. Diffuse thinning of the renal cortex. No
appreciable blood flow to the kidney.

Bladder:

Appears normal for degree of bladder distention.Right ureteral jet
is identified. No left ureteral jet.

Other:

None.
IMPRESSION: 1. Severe left hydronephrosis with marked thinning of the renal
cortex with no detectable perfusion to the thinned cortex. The site
and cause of obstruction is not apparent on this ultrasound exam.
2. No left ureteral jet identified in the bladder.
3. Normal appearing right kidney.

## 2022-07-16 ENCOUNTER — Telehealth: Payer: Self-pay

## 2022-07-16 NOTE — Telephone Encounter (Signed)
LVM for patient to call back. AS, CMA
